# Patient Record
Sex: Female | Born: 1955 | Race: White | Hispanic: No | Marital: Married | Smoking: Never smoker
Health system: Southern US, Community
[De-identification: ages and names within clinical notes are randomized; demographics above are authoritative.]

## PROBLEM LIST (undated history)

## (undated) DIAGNOSIS — N816 Rectocele: Secondary | ICD-10-CM

## (undated) HISTORY — DX: Rectocele: N81.6

## (undated) HISTORY — PX: OTHER SURGICAL HISTORY: SHX169

## (undated) HISTORY — PX: APPENDECTOMY: SHX54

## (undated) HISTORY — PX: TONSILLECTOMY: SUR1361

---

## 1985-01-11 HISTORY — PX: TUBAL LIGATION: SHX77

## 2001-01-26 ENCOUNTER — Ambulatory Visit (HOSPITAL_COMMUNITY): Admission: RE | Admit: 2001-01-26 | Discharge: 2001-01-26 | Payer: Self-pay | Admitting: Specialist

## 2001-01-26 ENCOUNTER — Encounter: Payer: Self-pay | Admitting: Specialist

## 2001-02-14 ENCOUNTER — Other Ambulatory Visit: Admission: RE | Admit: 2001-02-14 | Discharge: 2001-02-14 | Payer: Self-pay | Admitting: Obstetrics and Gynecology

## 2002-01-26 ENCOUNTER — Encounter: Payer: Self-pay | Admitting: Obstetrics and Gynecology

## 2002-01-26 ENCOUNTER — Ambulatory Visit (HOSPITAL_COMMUNITY): Admission: RE | Admit: 2002-01-26 | Discharge: 2002-01-26 | Payer: Self-pay | Admitting: Obstetrics and Gynecology

## 2003-01-28 ENCOUNTER — Ambulatory Visit (HOSPITAL_COMMUNITY): Admission: RE | Admit: 2003-01-28 | Discharge: 2003-01-28 | Payer: Self-pay | Admitting: Obstetrics and Gynecology

## 2004-01-30 ENCOUNTER — Ambulatory Visit (HOSPITAL_COMMUNITY): Admission: RE | Admit: 2004-01-30 | Discharge: 2004-01-30 | Payer: Self-pay | Admitting: Obstetrics and Gynecology

## 2005-02-01 ENCOUNTER — Ambulatory Visit (HOSPITAL_COMMUNITY): Admission: RE | Admit: 2005-02-01 | Discharge: 2005-02-01 | Payer: Self-pay | Admitting: Obstetrics and Gynecology

## 2005-09-28 ENCOUNTER — Ambulatory Visit: Payer: Self-pay | Admitting: Gastroenterology

## 2005-10-06 ENCOUNTER — Ambulatory Visit: Payer: Self-pay | Admitting: Gastroenterology

## 2005-10-06 ENCOUNTER — Ambulatory Visit (HOSPITAL_COMMUNITY): Admission: RE | Admit: 2005-10-06 | Discharge: 2005-10-06 | Payer: Self-pay | Admitting: Gastroenterology

## 2006-02-02 ENCOUNTER — Ambulatory Visit (HOSPITAL_COMMUNITY): Admission: RE | Admit: 2006-02-02 | Discharge: 2006-02-02 | Payer: Self-pay | Admitting: Obstetrics and Gynecology

## 2007-02-06 ENCOUNTER — Ambulatory Visit (HOSPITAL_COMMUNITY): Admission: RE | Admit: 2007-02-06 | Discharge: 2007-02-06 | Payer: Self-pay | Admitting: Obstetrics and Gynecology

## 2007-02-14 ENCOUNTER — Other Ambulatory Visit: Admission: RE | Admit: 2007-02-14 | Discharge: 2007-02-14 | Payer: Self-pay | Admitting: Obstetrics and Gynecology

## 2008-02-13 ENCOUNTER — Ambulatory Visit (HOSPITAL_COMMUNITY): Admission: RE | Admit: 2008-02-13 | Discharge: 2008-02-13 | Payer: Self-pay | Admitting: Obstetrics and Gynecology

## 2008-02-19 ENCOUNTER — Other Ambulatory Visit: Admission: RE | Admit: 2008-02-19 | Discharge: 2008-02-19 | Payer: Self-pay | Admitting: Obstetrics and Gynecology

## 2009-02-18 ENCOUNTER — Ambulatory Visit (HOSPITAL_COMMUNITY): Admission: RE | Admit: 2009-02-18 | Discharge: 2009-02-18 | Payer: Self-pay | Admitting: Obstetrics and Gynecology

## 2009-02-26 ENCOUNTER — Other Ambulatory Visit: Admission: RE | Admit: 2009-02-26 | Discharge: 2009-02-26 | Payer: Self-pay | Admitting: Obstetrics and Gynecology

## 2010-01-30 ENCOUNTER — Other Ambulatory Visit: Payer: Self-pay | Admitting: Obstetrics and Gynecology

## 2010-01-30 DIAGNOSIS — Z1239 Encounter for other screening for malignant neoplasm of breast: Secondary | ICD-10-CM

## 2010-02-01 ENCOUNTER — Encounter: Payer: Self-pay | Admitting: Obstetrics and Gynecology

## 2010-02-23 ENCOUNTER — Ambulatory Visit (HOSPITAL_COMMUNITY)
Admission: RE | Admit: 2010-02-23 | Discharge: 2010-02-23 | Disposition: A | Discharge status: Home / Self Care | Payer: BC Managed Care – PPO | Source: Ambulatory Visit | Attending: Obstetrics and Gynecology | Admitting: Obstetrics and Gynecology

## 2010-02-23 DIAGNOSIS — Z1231 Encounter for screening mammogram for malignant neoplasm of breast: Secondary | ICD-10-CM | POA: Insufficient documentation

## 2010-02-23 DIAGNOSIS — Z1239 Encounter for other screening for malignant neoplasm of breast: Secondary | ICD-10-CM

## 2010-03-02 ENCOUNTER — Other Ambulatory Visit (HOSPITAL_COMMUNITY)
Admission: RE | Admit: 2010-03-02 | Discharge: 2010-03-02 | Disposition: A | Discharge status: Home / Self Care | Payer: BC Managed Care – PPO | Source: Ambulatory Visit | Attending: Obstetrics & Gynecology | Admitting: Obstetrics & Gynecology

## 2010-03-02 DIAGNOSIS — Z01419 Encounter for gynecological examination (general) (routine) without abnormal findings: Secondary | ICD-10-CM | POA: Insufficient documentation

## 2010-03-03 ENCOUNTER — Other Ambulatory Visit: Payer: Self-pay | Admitting: Obstetrics & Gynecology

## 2010-05-29 NOTE — Op Note (Signed)
Julie Humphrey, Julie Humphrey               ACCOUNT NO.:  [id number]   MEDICAL RECORD NO.:  [id number]          PATIENT TYPE:  AMB   LOCATION:  DAY                           FACILITY:  [facility]   PHYSICIAN:  Kassie Mends, M.D.      DATE OF BIRTH:  December 28, 1955   DATE OF PROCEDURE:  10/06/2005  DATE OF DISCHARGE:                                 OPERATIVE REPORT   REFERRING PHYSICIAN:  Tilda Burrow, M.D.   PROCEDURE:  Colonoscopy.   INDICATION FOR EXAM:  Ms. Gebel is a 55 year old female who presented with  rectal bleeding.  She also presents for average-risk colon cancer screening.   FINDINGS:  1..  Normal colon without evidence of polyps, masses,  inflammatory changes, internal hemorrhoids or vascular ectasias.  No  diverticula evident.  1. Normal retroflexed view of the rectum.  2. Upon withdrawn the scope, she has 2 small internal-external      hemorrhoids.   RECOMMENDATIONS:  1. High-fiber diet.  Information given on hemorrhoids and high-fiber diet.  2. Follow with Dr. Christin Bach.   MEDICATIONS:  1. Demerol 75 mg IV.  2. Versed 5 mg IV.   PROCEDURE TECHNIQUE:  Physical exam was performed and informed consent was  obtained from the patient after explaining the benefits, risks and  alternatives to the procedure, which the patient appeared to understand and  so stated.  The patient was connected to the monitor and placed in the left  lateral position.  Continuous oxygen was provided by nasal cannula and IV  medicine administered through an indwelling cannula.  After rectal exam and  administration of sedation, the patient's rectum was intubated and the scope  was advanced under direct visualization  to the cecum.  The scope was subsequently removed by carefully examining the  anatomy, integrity and texture of the mucosa on the way out.  The patient  was recovered in the endoscopy suite and discharged home in satisfactory  condition.      Kassie Mends, M.D.  Electronically  Signed     SM/MEDQ  D:  10/06/2005  T:  10/08/2005  Job:  [id number]   cc:   Tilda Burrow, M.D.  Fax: [phone]

## 2010-05-29 NOTE — Consult Note (Signed)
NAMESUKARI, Julie Humphrey               ACCOUNT NO.:  [id number]   MEDICAL RECORD NO.:  [id number]          PATIENT TYPE:  AMB   LOCATION:  DAY                           FACILITY:  [facility]   PHYSICIAN:  Kassie Mends, M.D.      DATE OF BIRTH:  1955/03/22   DATE OF CONSULTATION:  09/28/2005  DATE OF DISCHARGE:                                   CONSULTATION   REASON FOR CONSULTATION:  Rectal bleeding.   HISTORY OF PRESENT ILLNESS:  Julie Humphrey is a 55 year old Caucasian female,  who tells me approximately 10 days ago she went to the bathroom.  She had a  regular bowel movement which was followed by a large amount of bright red  rectal bleeding.  She noticed it on the toilet paper as well as in the  commode.  She denies any abdominal or rectal pain at that time.  She did  complain of abdominal bloating for the last several months.  She noticed a  small amount of rectal pruritus.  Denies any known hemorrhoid disease.  She  generally has bowel movements once or twice a day, rarely has diarrhea,  denies any problems with constipation or abdominal pain.  Her weight has  steadily increasing.  She has had a normal TSH through Dr. Rayna Sexton  office.   PAST MEDICAL HISTORY:  Tonsillectomy, tubal ligation.   CURRENT MEDICATIONS:  Vitamin A 500 mg daily, vitamin B complex once daily,  vitamin C 500 mg daily, vitamin E 400 units daily, zinc once daily, selenium  once daily, evening primrose once daily, fish oil 1 g daily, flax seed 1 g  daily, chlorophyll once daily, glucosamine  once daily, calcium and vitamin  D 1200 mg daily, magnesium 55 mg daily.   ALLERGIES:  No known drug allergies.   FAMILY HISTORY:  No known family history of colorectal carcinoma or other  chronic GI problems noted.  Mother is 34 and has history of deep venous  thrombosis, father age 81 deceased secondary to myocardial infarction.   SOCIAL HISTORY:  Julie Humphrey has been married for 30 years.  She has three  grown healthy  children.  She is a Arts development officer.  She smokes socially.  She  drinks two to three days a week up to two alcoholic beverages, denies any  drug use.   REVIEW OF SYSTEMS:  CONSTITUTIONAL:  See HPI.  She denies any fatigue.  CARDIOVASCULAR:  Denies palpitations. PULMONARY:  No shortness of breath,  dyspnea, cough, hemoptysis.  GI:  See HPI.  Denies any dysphagia,  odynophagia, anorexia or early satiety, heartburn or indigestion.  HEMATOLOGICAL:  She has had episodic nosebleeds, has not had any recently.  She does bruise easily.   PHYSICAL EXAMINATION:  VITAL SIGNS:  Weight 192 pounds.  Height 66,  temperature 98.8, blood pressure 142/80, pulse 64.  GENERAL:  Julie Humphrey is a 55 year old obese Caucasian female who is alert,  oriented x4, in no acute distress.  HEENT:  Sclerae clear, nonicteric. Conjunctivae pink.  Oropharynx pink and  moist without lesions.  NECK:  Thyroid not palpable, without  mass or thyromegaly.  CHEST:  Heart regular rate and rhythm, normal S1, S2 without murmurs, rubs  or gallops.  LUNGS:  Clear to auscultation bilaterally.  ABDOMEN:  Protuberant with positive bowel sounds x 4, soft, nontender,  nondistended, without palpable masses hepatosplenomegaly,  without  tenderness or guarding.  EXTREMITIES:  Without clubbing or edema bilaterally.  SKIN:  Pink, warm and dry.  She does have multiple ecchymoses a large one to  her right upper extremity.   IMPRESSION:  Julie Humphrey is a 55 year old Caucasian female with an episode  of large volume rectal bleeding approximately 10 days ago.  This episode was  self limited.  She has never had a colonoscopy.  I discussed with her the  fact that have benign anorectal source of her bleeding including hemorrhoid,  diverticular bleeding or less likely, polyp or colorectal carcinoma.  She is  going to require further evaluation at this time.  She also tells me she is  bruising easily and has a history of episodic nose bleeds although has  not  had any recently so will check a CBC today.   PLAN:  1. Check CBC.  2. Follow-up with __________  I have discussed the procedure including the      risks and benefits including but not limited bleeding, infection,      perforation, drug reaction, __________ appropriate signed consent has      been obtained.  Further recommendation pending procedure.   We would like to thank Dr. Emelda Fear and Zerita Boers for allowing [place] to  participate in the care of Julie Humphrey.      Julie Humphrey, N.P.      Kassie Mends, M.D.  Electronically Signed    KC/MEDQ  D:  09/28/2005  T:  09/29/2005  Job:  [id number]   cc:   Tilda Burrow, M.D.  Fax: [phone]

## 2011-01-14 ENCOUNTER — Other Ambulatory Visit: Payer: Self-pay | Admitting: Obstetrics and Gynecology

## 2011-01-14 DIAGNOSIS — Z139 Encounter for screening, unspecified: Secondary | ICD-10-CM

## 2011-03-02 ENCOUNTER — Ambulatory Visit (HOSPITAL_COMMUNITY)
Admission: RE | Admit: 2011-03-02 | Discharge: 2011-03-02 | Disposition: A | Discharge status: Home / Self Care | Payer: Managed Care, Other (non HMO) | Source: Ambulatory Visit | Attending: Obstetrics and Gynecology | Admitting: Obstetrics and Gynecology

## 2011-03-02 DIAGNOSIS — Z1231 Encounter for screening mammogram for malignant neoplasm of breast: Secondary | ICD-10-CM | POA: Insufficient documentation

## 2011-03-02 DIAGNOSIS — Z139 Encounter for screening, unspecified: Secondary | ICD-10-CM

## 2011-03-17 ENCOUNTER — Other Ambulatory Visit: Payer: Self-pay | Admitting: Obstetrics and Gynecology

## 2011-03-17 ENCOUNTER — Other Ambulatory Visit (HOSPITAL_COMMUNITY)
Admission: RE | Admit: 2011-03-17 | Discharge: 2011-03-17 | Disposition: A | Discharge status: Home / Self Care | Payer: Managed Care, Other (non HMO) | Source: Ambulatory Visit | Attending: Obstetrics and Gynecology | Admitting: Obstetrics and Gynecology

## 2011-03-17 DIAGNOSIS — Z01419 Encounter for gynecological examination (general) (routine) without abnormal findings: Secondary | ICD-10-CM | POA: Insufficient documentation

## 2011-03-17 LAB — HM PAP SMEAR: HM Pap smear: NEGATIVE

## 2011-05-12 ENCOUNTER — Other Ambulatory Visit (HOSPITAL_COMMUNITY): Payer: Self-pay | Admitting: Pulmonary Disease

## 2011-05-12 ENCOUNTER — Ambulatory Visit (HOSPITAL_COMMUNITY)
Admission: RE | Admit: 2011-05-12 | Discharge: 2011-05-12 | Disposition: A | Discharge status: Home / Self Care | Payer: Managed Care, Other (non HMO) | Source: Ambulatory Visit | Attending: Pulmonary Disease | Admitting: Pulmonary Disease

## 2011-05-12 DIAGNOSIS — M25519 Pain in unspecified shoulder: Secondary | ICD-10-CM

## 2012-01-21 ENCOUNTER — Other Ambulatory Visit: Payer: Self-pay | Admitting: Obstetrics and Gynecology

## 2012-01-21 DIAGNOSIS — Z139 Encounter for screening, unspecified: Secondary | ICD-10-CM

## 2012-03-02 ENCOUNTER — Ambulatory Visit (HOSPITAL_COMMUNITY)
Admission: RE | Admit: 2012-03-02 | Discharge: 2012-03-02 | Disposition: A | Discharge status: Home / Self Care | Payer: Managed Care, Other (non HMO) | Source: Ambulatory Visit | Attending: Obstetrics and Gynecology | Admitting: Obstetrics and Gynecology

## 2012-03-02 DIAGNOSIS — Z139 Encounter for screening, unspecified: Secondary | ICD-10-CM

## 2012-03-02 DIAGNOSIS — Z1231 Encounter for screening mammogram for malignant neoplasm of breast: Secondary | ICD-10-CM | POA: Insufficient documentation

## 2012-03-02 LAB — HM MAMMOGRAPHY

## 2012-03-23 ENCOUNTER — Encounter: Payer: Self-pay | Admitting: *Deleted

## 2012-03-29 ENCOUNTER — Encounter: Payer: Self-pay | Admitting: Obstetrics and Gynecology

## 2012-03-29 ENCOUNTER — Ambulatory Visit (INDEPENDENT_AMBULATORY_CARE_PROVIDER_SITE_OTHER): Payer: Managed Care, Other (non HMO) | Admitting: Obstetrics and Gynecology

## 2012-03-29 ENCOUNTER — Other Ambulatory Visit (HOSPITAL_COMMUNITY)
Admission: RE | Admit: 2012-03-29 | Discharge: 2012-03-29 | Disposition: A | Discharge status: Home / Self Care | Payer: 59 | Source: Ambulatory Visit | Attending: Obstetrics and Gynecology | Admitting: Obstetrics and Gynecology

## 2012-03-29 ENCOUNTER — Other Ambulatory Visit: Payer: Self-pay | Admitting: Obstetrics and Gynecology

## 2012-03-29 VITALS — BP 100/70 | HR 72 | Resp 16 | Ht 66.0 in | Wt 208.8 lb

## 2012-03-29 DIAGNOSIS — Z01419 Encounter for gynecological examination (general) (routine) without abnormal findings: Secondary | ICD-10-CM | POA: Insufficient documentation

## 2012-03-29 DIAGNOSIS — N816 Rectocele: Secondary | ICD-10-CM

## 2012-03-29 DIAGNOSIS — Z1389 Encounter for screening for other disorder: Secondary | ICD-10-CM

## 2012-03-29 DIAGNOSIS — Z1212 Encounter for screening for malignant neoplasm of rectum: Secondary | ICD-10-CM

## 2012-03-29 DIAGNOSIS — Z1151 Encounter for screening for human papillomavirus (HPV): Secondary | ICD-10-CM | POA: Insufficient documentation

## 2012-03-29 HISTORY — DX: Rectocele: N81.6

## 2012-03-29 LAB — POCT URINALYSIS DIPSTICK
Glucose, UA: NEGATIVE
Ketones, UA: NEGATIVE
Leukocytes, UA: NEGATIVE
Protein, UA: NEGATIVE

## 2012-03-29 MED ORDER — TRETINOIN 0.025 % EX CREA: TOPICAL_CREAM | Freq: Every day | CUTANEOUS | Status: AC

## 2012-03-29 NOTE — Progress Notes (Signed)
Subjective:    Julie Humphrey is a 57 y.o. female who presents for annual exam. The patient has noted a sense of pelvic heaviness, right-sided more than left. No change in clothing no weight gain or weight loss other than 10 pounds loss 2 dietary changes. The patient is sexually active. GYN screening history: last pap: was normal. The patient is not taking hormone replacement therapy. Patient denies post-menopausal vaginal bleeding.. The patient wears seatbelts: yes. The patient participates in regular exercise: no. Has the patient ever been transfused or tattooed?: no. The patient reports that there is not domestic violence in her life.   Menstrual History: OB History   Grav Para Term Preterm Abortions TAB SAB Ect Mult Living                  Menarche age:  Patient's last menstrual period was 10/30/2006.      Review of Systems Genitourinary:occasional SUI  BP 100/70  Pulse 72  Resp 16  Ht 5\' 6"  (1.676 m)  Wt 208 lb 12.8 oz (94.711 kg)  BMI 33.72 kg/m2  LMP 10/30/2006  General Appearance:    Alert, cooperative, no distress, appears stated age  Head:    Normocephalic, without obvious abnormality, atraumatic  Eyes:    PERRL, conjunctiva/corneas clear, EOM's intact, fundi    benign, both eyes  Ears:    Normal TM's and external ear canals, both ears  Nose:   Nares normal, septum midline, mucosa normal, no drainage    or sinus tenderness  Throat:   Lips, mucosa, and tongue normal; teeth and gums normal  Neck:   Supple, symmetrical, trachea midline, no adenopathy;    thyroid:  no enlargement/tenderness/nodules; no carotid   bruit or JVD  Back:     Symmetric, no curvature, ROM normal, no CVA tenderness  Lungs:     Clear to auscultation bilaterally, respirations unlabored  Chest Wall:    No tenderness or deformity   Heart:    Regular rate and rhythm, S1 and S2 normal, no murmur, rub   or gallop  Breast Exam:    No tenderness, masses, or nipple abnormality  Abdomen:     Soft,  non-tender, bowel sounds active all four quadrants,    no masses, no organomegaly  Genitalia:    Normal female without lesion, discharge or tenderness  Rectal:    Normal tone, normal prostate, no masses or tenderness;   guaiac negative stool, SMALL RECOTCELE TO GREATER THAN 90 DEGREE  Extremities:   Extremities normal, atraumatic, no cyanosis or edema  Pulses:   2+ and symmetric all extremities  Skin:   Skin color, texture, turgor normal, no rashes or lesions  Lymph nodes:   Cervical, supraclavicular, and axillary nodes normal  Neurologic:   CNII-XII intact, normal strength, sensation and reflexes    throughout      Assessment:    Normal gyn exam cystic acne, treated with tretinoiin   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  Plan:    All questions answered. Await pap smear results. Urinalysis. Reassured regarding a pelvic heaviness. Rectocele discussed and patient will follow from a

## 2012-03-29 NOTE — Patient Instructions (Signed)
exercise options reviewed with details including exercise program per room focusing on asymmetric exercises while watching TV, weightbearing exercises in order to increase muscle mass

## 2013-01-15 ENCOUNTER — Other Ambulatory Visit: Payer: Self-pay | Admitting: Obstetrics and Gynecology

## 2013-01-15 DIAGNOSIS — Z139 Encounter for screening, unspecified: Secondary | ICD-10-CM

## 2013-03-05 ENCOUNTER — Ambulatory Visit (HOSPITAL_COMMUNITY)
Admission: RE | Admit: 2013-03-05 | Discharge: 2013-03-05 | Disposition: A | Discharge status: Home / Self Care | Payer: Managed Care, Other (non HMO) | Source: Ambulatory Visit | Attending: Obstetrics and Gynecology | Admitting: Obstetrics and Gynecology

## 2013-03-05 ENCOUNTER — Ambulatory Visit (HOSPITAL_COMMUNITY): Payer: 59

## 2013-03-05 DIAGNOSIS — Z139 Encounter for screening, unspecified: Secondary | ICD-10-CM

## 2013-03-05 DIAGNOSIS — Z1231 Encounter for screening mammogram for malignant neoplasm of breast: Secondary | ICD-10-CM | POA: Insufficient documentation

## 2013-04-04 ENCOUNTER — Encounter: Payer: Self-pay | Admitting: Obstetrics and Gynecology

## 2013-04-04 ENCOUNTER — Ambulatory Visit (INDEPENDENT_AMBULATORY_CARE_PROVIDER_SITE_OTHER): Payer: Managed Care, Other (non HMO) | Admitting: Obstetrics and Gynecology

## 2013-04-04 VITALS — BP 120/82 | Ht 66.0 in | Wt 218.0 lb

## 2013-04-04 DIAGNOSIS — Z0000 Encounter for general adult medical examination without abnormal findings: Secondary | ICD-10-CM

## 2013-04-04 DIAGNOSIS — N816 Rectocele: Secondary | ICD-10-CM

## 2013-04-04 DIAGNOSIS — R635 Abnormal weight gain: Secondary | ICD-10-CM | POA: Insufficient documentation

## 2013-04-04 DIAGNOSIS — Z01419 Encounter for gynecological examination (general) (routine) without abnormal findings: Secondary | ICD-10-CM

## 2013-04-04 LAB — COMPREHENSIVE METABOLIC PANEL
ALBUMIN: 4.3 g/dL (ref 3.5–5.2)
ALK PHOS: 53 U/L (ref 39–117)
ALT: 31 U/L (ref 0–35)
AST: 27 U/L (ref 0–37)
BILIRUBIN TOTAL: 1.2 mg/dL (ref 0.2–1.2)
BUN: 16 mg/dL (ref 6–23)
CHLORIDE: 101 meq/L (ref 96–112)
CO2: 29 mEq/L (ref 19–32)
Calcium: 9.3 mg/dL (ref 8.4–10.5)
Creat: 0.87 mg/dL (ref 0.50–1.10)
GLUCOSE: 99 mg/dL (ref 70–99)
Potassium: 4.7 mEq/L (ref 3.5–5.3)
SODIUM: 139 meq/L (ref 135–145)
Total Protein: 7 g/dL (ref 6.0–8.3)

## 2013-04-04 LAB — TSH: TSH: 2.585 u[IU]/mL (ref 0.350–4.500)

## 2013-04-04 MED ORDER — PHENTERMINE HCL 37.5 MG PO CAPS
37.5000 mg | ORAL_CAPSULE | ORAL | Status: DC
Start: 2013-04-04 — End: 2013-05-09

## 2013-04-04 NOTE — Patient Instructions (Addendum)
Check out phentermine for weight loss. Please check your blood pressure while on the medication and weigh daily. Also start exercising daily to help with weight loss. Return for a recheck in one month to check progress.

## 2013-04-04 NOTE — Progress Notes (Addendum)
This chart was scribed by Jenne Campus, Medical Scribe, for Dr. Mallory Shirk on 04/04/12 at 9:59 AM. This chart was reviewed by Dr. Mallory Shirk and is accurate.  Assessment:  Annual Gyn Exam   Plan:  1. Pap smear not done, next pap due 2 years 2. Return annually or prn 3    Annual mammogram advised 4.   Weight loss discussed.  5.   TSH and Comp Met Subjective:  Julie Humphrey is a 58 y.o. female No obstetric history on file. who presents for annual exam. Patient's last menstrual period was 10/30/2006. The patient has no complaints today. Needs annual blood work.   Last PAP 03/2012: NEGATIVE FOR INTRAEPITHELIAL LESIONS OR MALIGNANCY.  PCP is Dr. Luan Pulling  The following portions of the patient's history were reviewed and updated as appropriate: allergies, current medications, past family history, past medical history, past social history, past surgical history and problem list.  Review of Systems Constitutional: negative Gastrointestinal: negative Genitourinary: None  Objective:  BP 120/82  Ht 5' 6"  (1.676 m)  Wt 218 lb (98.884 kg)  BMI 35.20 kg/m2  LMP 10/30/2006   BMI: Body mass index is 35.2 kg/(m^2).  Chaperone present for exam which was performed with pt's permission General Appearance: Alert, appropriate appearance for age. No acute distress HEENT: Grossly normal Neck / Thyroid: no thyromegaly noted  Cardiovascular: RRR; normal S1, S2, no murmur Lungs: CTA bilaterally Back: No CVAT Breast Exam: No dimpling, nipple retraction or discharge. No masses or nodes., Normal to inspection, Normal breast tissue bilaterally and No masses or nodes.No dimpling, nipple retraction or discharge. Gastrointestinal: Soft, non-tender, no masses or organomegaly Pelvic Exam: External genitalia: normal general appearance Vaginal: atrophic mucosa Cervix: normal appearance Adnexa: normal bimanual exam Uterus: normal single, nontender Good anterior support noted Rectovaginal: low  rectocele noted Lymphatic Exam: Non-palpable nodes in neck, clavicular, axillary, or inguinal regions  Skin: no rash or abnormalities Neurologic: Normal gait and speech, no tremor  Psychiatric: Alert and oriented, appropriate affect.  Urinalysis:Not done  Mallory Shirk. MD Pgr [phone] 10:11 AM

## 2013-04-06 ENCOUNTER — Telehealth: Payer: Self-pay | Admitting: *Deleted

## 2013-04-06 NOTE — Telephone Encounter (Signed)
Pt informed of WNL thyroid and CMP results per Dr. Glo Herring, f/u 2 years.

## 2013-04-06 NOTE — Telephone Encounter (Signed)
Message copied by Doyne Keel on Fri Apr 06, 2013  8:45 AM ------      Message from: Jonnie Kind      Created: Thu Apr 05, 2013 10:29 PM       Normal thyroid function and CMP. Followup 2 yr ------

## 2013-05-09 ENCOUNTER — Ambulatory Visit (INDEPENDENT_AMBULATORY_CARE_PROVIDER_SITE_OTHER): Payer: Managed Care, Other (non HMO) | Admitting: Obstetrics and Gynecology

## 2013-05-09 ENCOUNTER — Encounter: Payer: Self-pay | Admitting: Obstetrics and Gynecology

## 2013-05-09 VITALS — BP 122/80 | Ht 66.0 in | Wt 206.0 lb

## 2013-05-09 DIAGNOSIS — T50905A Adverse effect of unspecified drugs, medicaments and biological substances, initial encounter: Principal | ICD-10-CM

## 2013-05-09 DIAGNOSIS — R634 Abnormal weight loss: Secondary | ICD-10-CM

## 2013-05-09 DIAGNOSIS — M6740 Ganglion, unspecified site: Secondary | ICD-10-CM | POA: Insufficient documentation

## 2013-05-09 MED ORDER — PHENTERMINE HCL 37.5 MG PO CAPS: 37.5000 mg | ORAL_CAPSULE | ORAL | Status: DC

## 2013-05-09 NOTE — Progress Notes (Signed)
This chart was scribed by Jenne Campus, Medical Scribe, for Dr. Mallory Shirk on 05/09/13 at 10:02 AM. This chart was reviewed by Dr. Mallory Shirk and is accurate.   [facility] Visit  Patient name: Julie Humphrey MRN [id number]  Date of birth: March 26, 1955  CC & HPI:  Julie Humphrey is a 58 y.o. female presenting today for BP and weight loss check while on phentermine. Reports 12 lb weight loss and improved bilateral ankle pain.   ROS:  12lb weight loss since last visit one month ago Improved bilateral ankle pain  Non-painful tiny ganglion cyst in the center palm of right hand, reports family h/o same  No other complaints  Pertinent History Reviewed:  Medical & Surgical Hx:  Reviewed: Significant for rectocele Medications: Reviewed & Updated - see associated section Social History: Reviewed -  reports that she has never smoked. She has never used smokeless tobacco.  Objective Findings:  Vitals: BP 122/80  Ht 5\' 6"  (1.676 m)  Wt 206 lb (93.441 kg)  BMI 33.27 kg/m2  LMP 10/30/2006  Physical Examination: Not indicated   Assessment & Plan:  A: 1. 12 lb weight loss  2. Tiny ganglion cyst in right hand   P: 1. Continue phentermine for 3 months 2. Referral to Roseanne Kaufman, hand surgery, given 3. Pt to return in 2 months for cholesterol check

## 2013-07-04 ENCOUNTER — Ambulatory Visit: Payer: Managed Care, Other (non HMO) | Admitting: Obstetrics and Gynecology

## 2013-07-16 ENCOUNTER — Ambulatory Visit: Payer: Managed Care, Other (non HMO) | Admitting: Obstetrics and Gynecology

## 2013-07-25 ENCOUNTER — Encounter: Payer: Self-pay | Admitting: Obstetrics and Gynecology

## 2013-07-25 ENCOUNTER — Ambulatory Visit (INDEPENDENT_AMBULATORY_CARE_PROVIDER_SITE_OTHER): Payer: Managed Care, Other (non HMO) | Admitting: Obstetrics and Gynecology

## 2013-07-25 VITALS — BP 120/78 | Ht 66.0 in | Wt 198.0 lb

## 2013-07-25 DIAGNOSIS — Z13220 Encounter for screening for lipoid disorders: Secondary | ICD-10-CM

## 2013-07-25 DIAGNOSIS — T50905A Adverse effect of unspecified drugs, medicaments and biological substances, initial encounter: Secondary | ICD-10-CM

## 2013-07-25 DIAGNOSIS — R634 Abnormal weight loss: Secondary | ICD-10-CM

## 2013-07-25 LAB — LIPID PANEL
CHOL/HDL RATIO: 2.4 ratio
Cholesterol: 224 mg/dL — ABNORMAL HIGH (ref 0–200)
HDL: 94 mg/dL (ref >39)
LDL Cholesterol: 119 mg/dL — ABNORMAL HIGH (ref 0–99)
TRIGLYCERIDES: 55 mg/dL (ref <150)
VLDL: 11 mg/dL (ref 0–40)

## 2013-07-25 MED ORDER — PHENTERMINE HCL 30 MG PO CAPS: 30.0000 mg | ORAL_CAPSULE | ORAL | Status: DC

## 2013-07-25 NOTE — Progress Notes (Signed)
This chart was scribed for Dr. Jonnie Kind by Neta Ehlers, Medical Scribe, on 07/25/13 initiated at 10:08 am.   [facility] Visit  Patient name: Julie Humphrey MRN [id number]  Date of birth: 15-May-1955  CC & HPI:  Julie Humphrey is a 58 y.o. female presenting today for a two month follow-up.   Julie Humphrey endorses a weight loss of 20 pounds by using [employer] and phentermine; she denies elevated BP with phentermine. She also denies chronic urinary incontinence. She attributes one episode of urinary incontinence to a sever episode of coughing.   ROS:  + SUI + congestion + cough  No other complaints   Pertinent History Reviewed:   Reviewed: Significant for:  Medical: obesity                               Surgical Hx:    Medications: Reviewed & Updated - see associated section Social History: Reviewed -  reports that she has never smoked. She has never used smokeless tobacco.  Objective Findings:  Vitals: Blood pressure 120/78, height 5\' 6"  (1.676 m), weight 198 lb (89.812 kg), last menstrual period 10/30/2006.    Assessment & Plan:   A: 1. Obesity improved  BMI 31   P: 1. Renew phentermine for two months at lower dose of 30 mg  2. Follow-up in two months

## 2013-07-26 ENCOUNTER — Telehealth: Payer: Self-pay | Admitting: Obstetrics and Gynecology

## 2013-07-26 NOTE — Telephone Encounter (Signed)
CHolesterol HDL very high , which is good

## 2013-07-27 ENCOUNTER — Telehealth: Payer: Self-pay | Admitting: Obstetrics and Gynecology

## 2013-07-27 NOTE — Telephone Encounter (Signed)
Pt aware of Cholesterol results. Pt asked for a copy to be mailed to her.

## 2013-10-03 ENCOUNTER — Ambulatory Visit: Payer: Managed Care, Other (non HMO) | Admitting: Obstetrics and Gynecology

## 2013-10-09 ENCOUNTER — Ambulatory Visit: Payer: Managed Care, Other (non HMO) | Admitting: Obstetrics and Gynecology

## 2013-10-10 ENCOUNTER — Encounter: Payer: Self-pay | Admitting: Obstetrics and Gynecology

## 2013-10-10 ENCOUNTER — Ambulatory Visit (INDEPENDENT_AMBULATORY_CARE_PROVIDER_SITE_OTHER): Payer: Managed Care, Other (non HMO) | Admitting: Obstetrics and Gynecology

## 2013-10-10 VITALS — BP 124/80 | Ht 66.0 in | Wt 191.0 lb

## 2013-10-10 DIAGNOSIS — E669 Obesity, unspecified: Secondary | ICD-10-CM

## 2013-10-10 MED ORDER — PHENTERMINE HCL 30 MG PO CAPS
30.0000 mg | ORAL_CAPSULE | ORAL | Status: DC
Start: 2013-10-10 — End: 2017-10-13

## 2013-10-10 NOTE — Progress Notes (Signed)
Patient ID: Julie Humphrey, female   DOB: [birth year], 58 y.o.   MRN: [id number] Pt here today for follow up to recheck weight and BP.

## 2013-10-10 NOTE — Progress Notes (Signed)
   [facility] Visit  Patient name: Julie Humphrey MRN [id number]  Date of birth: [birth year]  CC & HPI:  Julie Humphrey is a 58 y.o. female presenting today for f/u on weight loss and BP. She states that she does a few hours a week of yard work and housework. She states that she will rides the bikes at the beach. She states that she does not work currently. She states that she has loss 7 lbs since her last visit in July. She states that her Rx of phentermine made her motivated to do work. She states that her husband is her support system.  ROS:  +Weight Loss No other complaints  Pertinent History Reviewed:   Reviewed: Significant for  Medical         Past Medical History  Diagnosis Date  . Rectocele 03/29/2012                              Surgical Hx:    Past Surgical History  Procedure Laterality Date  . Tonsillectomy    . Tubal ligation Bilateral 1987   Medications: Reviewed & Updated - see associated section                      Current outpatient prescriptions:calcium carbonate (OS-CAL) 600 MG TABS, Take 600 mg by mouth 2 (two) times daily with a meal., Disp: , Rfl: ;  cholecalciferol (VITAMIN D) 1000 UNITS tablet, Take 1,000 Units by mouth daily., Disp: , Rfl: ;  Coenzyme Q10 200 MG capsule, Take 200 mg by mouth daily., Disp: , Rfl: ;  Evening Primrose Oil 500 MG CAPS, Take by mouth., Disp: , Rfl: ;  Glucosamine-Chondroitin (COSAMIN DS PO), Take by mouth., Disp: , Rfl:  KRILL OIL OMEGA-3 PO, Take by mouth., Disp: , Rfl: ;  Misc Natural Products (TURMERIC CURCUMIN) CAPS, Take 4 capsules by mouth daily., Disp: , Rfl: ;  phentermine 30 MG capsule, Take 1 capsule (30 mg total) by mouth every morning., Disp: 30 capsule, Rfl: 2;  tretinoin (RETIN-A) 0.025 % cream, Apply topically at bedtime., Disp: 45 g, Rfl: 2;  vitamin C (ASCORBIC ACID) 500 MG tablet, Take 500 mg by mouth daily., Disp: , Rfl:  vitamin E 400 UNIT capsule, Take 400 Units by mouth daily., Disp: , Rfl: ;  zinc  gluconate 50 MG tablet, Take 50 mg by mouth daily., Disp: , Rfl:    Social History: Reviewed -  reports that she has never smoked. She has never used smokeless tobacco.  Objective Findings:  Vitals: Blood pressure 124/80, height 5\' 6"  (1.676 m), weight 191 lb (86.637 kg), last menstrual period 10/30/2006.  Physical Examination:  General appearance - alert, well appearing, and in no distress and oriented to person, place, and time, 7 lb weight loss since last visit in July.    Assessment & Plan:   A:  1. 7 lb weight loss since last visit in July. 2.   P:  1. Counseling on weight loss.  2. Refill on Phentermine Rx for 2 months.  3. F/U prn   This chart was scribed for Jonnie Kind, MD by Steva Colder, ED Scribe. The patient was seen in the office at 4:33 PM.

## 2014-05-21 ENCOUNTER — Other Ambulatory Visit: Payer: Self-pay | Admitting: Obstetrics and Gynecology

## 2017-09-30 ENCOUNTER — Encounter (HOSPITAL_COMMUNITY)
Admission: EM | Disposition: A | Discharge status: Home / Self Care | Payer: Self-pay | Source: Home / Self Care | Attending: Emergency Medicine

## 2017-09-30 ENCOUNTER — Emergency Department (HOSPITAL_COMMUNITY): Payer: Self-pay | Admitting: Anesthesiology

## 2017-09-30 ENCOUNTER — Other Ambulatory Visit: Payer: Self-pay

## 2017-09-30 ENCOUNTER — Emergency Department (HOSPITAL_COMMUNITY): Payer: Self-pay

## 2017-09-30 ENCOUNTER — Encounter (HOSPITAL_COMMUNITY): Payer: Self-pay | Admitting: Emergency Medicine

## 2017-09-30 ENCOUNTER — Observation Stay (HOSPITAL_COMMUNITY)
Admission: EM | Admit: 2017-09-30 | Discharge: 2017-10-01 | Disposition: A | Discharge status: Home / Self Care | Payer: Managed Care, Other (non HMO) | Attending: General Surgery | Admitting: General Surgery

## 2017-09-30 DIAGNOSIS — K381 Appendicular concretions: Secondary | ICD-10-CM | POA: Insufficient documentation

## 2017-09-30 DIAGNOSIS — Z9049 Acquired absence of other specified parts of digestive tract: Secondary | ICD-10-CM

## 2017-09-30 DIAGNOSIS — K3580 Unspecified acute appendicitis: Secondary | ICD-10-CM

## 2017-09-30 DIAGNOSIS — K3533 Acute appendicitis with perforation and localized peritonitis, with abscess: Principal | ICD-10-CM | POA: Insufficient documentation

## 2017-09-30 DIAGNOSIS — I700 Atherosclerosis of aorta: Secondary | ICD-10-CM | POA: Insufficient documentation

## 2017-09-30 HISTORY — PX: LAPAROSCOPIC APPENDECTOMY: SHX408

## 2017-09-30 LAB — CBC
HCT: 42.1 % (ref 36.0–46.0)
HEMOGLOBIN: 14.4 g/dL (ref 12.0–15.0)
MCH: 31 pg (ref 26.0–34.0)
MCHC: 34.2 g/dL (ref 30.0–36.0)
MCV: 90.5 fL (ref 78.0–100.0)
Platelets: 192 10*3/uL (ref 150–400)
RBC: 4.65 MIL/uL (ref 3.87–5.11)
RDW: 13.4 % (ref 11.5–15.5)
WBC: 6.9 10*3/uL (ref 4.0–10.5)

## 2017-09-30 LAB — COMPREHENSIVE METABOLIC PANEL
ALK PHOS: 56 U/L (ref 38–126)
ALT: 26 U/L (ref 0–44)
ANION GAP: 8 (ref 5–15)
AST: 24 U/L (ref 15–41)
Albumin: 4.1 g/dL (ref 3.5–5.0)
BUN: 12 mg/dL (ref 8–23)
CO2: 28 mmol/L (ref 22–32)
CREATININE: 0.95 mg/dL (ref 0.44–1.00)
Calcium: 9.6 mg/dL (ref 8.9–10.3)
Chloride: 103 mmol/L (ref 98–111)
Glucose, Bld: 114 mg/dL — ABNORMAL HIGH (ref 70–99)
Potassium: 4.1 mmol/L (ref 3.5–5.1)
Sodium: 139 mmol/L (ref 135–145)
Total Bilirubin: 2.2 mg/dL — ABNORMAL HIGH (ref 0.3–1.2)
Total Protein: 7.7 g/dL (ref 6.5–8.1)

## 2017-09-30 LAB — URINALYSIS, ROUTINE W REFLEX MICROSCOPIC
BILIRUBIN URINE: NEGATIVE
Glucose, UA: NEGATIVE mg/dL
Hgb urine dipstick: NEGATIVE
KETONES UR: NEGATIVE mg/dL
LEUKOCYTES UA: NEGATIVE
Nitrite: NEGATIVE
Protein, ur: NEGATIVE mg/dL
Specific Gravity, Urine: 1.009 (ref 1.005–1.030)
pH: 6 (ref 5.0–8.0)

## 2017-09-30 LAB — LIPASE, BLOOD: Lipase: 25 U/L (ref 11–51)

## 2017-09-30 SURGERY — APPENDECTOMY, LAPAROSCOPIC
Anesthesia: General

## 2017-09-30 MED ORDER — SUCCINYLCHOLINE CHLORIDE 20 MG/ML IJ SOLN
INTRAMUSCULAR | Status: AC
  Filled 2017-09-30: qty 1

## 2017-09-30 MED ORDER — FENTANYL CITRATE (PF) 100 MCG/2ML IJ SOLN
INTRAMUSCULAR | Status: DC | PRN
  Administered 2017-09-30 (×2): 50 ug via INTRAVENOUS

## 2017-09-30 MED ORDER — KETOROLAC TROMETHAMINE 30 MG/ML IJ SOLN: 30.0000 mg | Freq: Four times a day (QID) | INTRAMUSCULAR | Status: AC

## 2017-09-30 MED ORDER — LIDOCAINE HCL (PF) 1 % IJ SOLN
INTRAMUSCULAR | Status: AC
  Filled 2017-09-30: qty 5

## 2017-09-30 MED ORDER — DEXAMETHASONE SODIUM PHOSPHATE 4 MG/ML IJ SOLN
INTRAMUSCULAR | Status: AC
  Filled 2017-09-30: qty 1

## 2017-09-30 MED ORDER — ONDANSETRON HCL 4 MG/2ML IJ SOLN
INTRAMUSCULAR | Status: DC | PRN
  Administered 2017-09-30: 4 mg via INTRAVENOUS

## 2017-09-30 MED ORDER — ONDANSETRON HCL 4 MG/2ML IJ SOLN: 4.0000 mg | Freq: Once | INTRAMUSCULAR | Status: DC | PRN

## 2017-09-30 MED ORDER — GLYCOPYRROLATE 0.2 MG/ML IJ SOLN
INTRAMUSCULAR | Status: AC
  Filled 2017-09-30: qty 2

## 2017-09-30 MED ORDER — HYDROCODONE-ACETAMINOPHEN 7.5-325 MG PO TABS: 1.0000 | ORAL_TABLET | Freq: Once | ORAL | Status: DC | PRN

## 2017-09-30 MED ORDER — CHLORHEXIDINE GLUCONATE CLOTH 2 % EX PADS: 6.0000 | MEDICATED_PAD | Freq: Once | CUTANEOUS | Status: DC

## 2017-09-30 MED ORDER — DIPHENHYDRAMINE HCL 50 MG/ML IJ SOLN: 12.5000 mg | Freq: Four times a day (QID) | INTRAMUSCULAR | Status: DC | PRN

## 2017-09-30 MED ORDER — BUPIVACAINE LIPOSOME 1.3 % IJ SUSP
INTRAMUSCULAR | Status: AC
  Filled 2017-09-30: qty 20

## 2017-09-30 MED ORDER — GLYCOPYRROLATE 0.2 MG/ML IJ SOLN
INTRAMUSCULAR | Status: DC | PRN
  Administered 2017-09-30: 0.4 mg via INTRAVENOUS

## 2017-09-30 MED ORDER — KETOROLAC TROMETHAMINE 30 MG/ML IJ SOLN
INTRAMUSCULAR | Status: AC
  Filled 2017-09-30: qty 1

## 2017-09-30 MED ORDER — SODIUM CHLORIDE 0.9 % IJ SOLN
INTRAMUSCULAR | Status: AC
  Filled 2017-09-30: qty 10

## 2017-09-30 MED ORDER — DIPHENHYDRAMINE HCL 12.5 MG/5ML PO ELIX: 12.5000 mg | ORAL_SOLUTION | Freq: Four times a day (QID) | ORAL | Status: DC | PRN

## 2017-09-30 MED ORDER — HYDROMORPHONE HCL 1 MG/ML IJ SOLN: 0.2500 mg | INTRAMUSCULAR | Status: DC | PRN

## 2017-09-30 MED ORDER — MEPERIDINE HCL 50 MG/ML IJ SOLN: 6.2500 mg | INTRAMUSCULAR | Status: DC | PRN

## 2017-09-30 MED ORDER — SUCCINYLCHOLINE CHLORIDE 20 MG/ML IJ SOLN
INTRAMUSCULAR | Status: DC | PRN
  Administered 2017-09-30: 100 mg via INTRAVENOUS

## 2017-09-30 MED ORDER — IOPAMIDOL (ISOVUE-300) INJECTION 61%
100.0000 mL | Freq: Once | INTRAVENOUS | Status: AC | PRN
  Administered 2017-09-30: 100 mL via INTRAVENOUS

## 2017-09-30 MED ORDER — LORAZEPAM 2 MG/ML IJ SOLN: 1.0000 mg | INTRAMUSCULAR | Status: DC | PRN

## 2017-09-30 MED ORDER — ROCURONIUM BROMIDE 50 MG/5ML IV SOLN
INTRAVENOUS | Status: AC
  Filled 2017-09-30: qty 1

## 2017-09-30 MED ORDER — SODIUM CHLORIDE 0.9 % IV SOLN
INTRAVENOUS | Status: DC
  Administered 2017-09-30: 20:00:00 via INTRAVENOUS

## 2017-09-30 MED ORDER — DEXAMETHASONE SODIUM PHOSPHATE 4 MG/ML IJ SOLN
INTRAMUSCULAR | Status: DC | PRN
  Administered 2017-09-30: 8 mg via INTRAVENOUS

## 2017-09-30 MED ORDER — POVIDONE-IODINE 10 % EX OINT
TOPICAL_OINTMENT | CUTANEOUS | Status: AC
  Filled 2017-09-30: qty 1

## 2017-09-30 MED ORDER — HYDROMORPHONE HCL 1 MG/ML IJ SOLN
1.0000 mg | INTRAMUSCULAR | Status: DC | PRN
  Administered 2017-09-30: 1 mg via INTRAVENOUS
  Filled 2017-09-30: qty 1

## 2017-09-30 MED ORDER — SODIUM CHLORIDE 0.9 % IR SOLN
Status: DC | PRN
  Administered 2017-09-30: 1000 mL

## 2017-09-30 MED ORDER — PIPERACILLIN-TAZOBACTAM 3.375 G IVPB
3.3750 g | Freq: Three times a day (TID) | INTRAVENOUS | Status: DC
  Administered 2017-10-01: 3.375 g via INTRAVENOUS
  Filled 2017-09-30 (×2): qty 50

## 2017-09-30 MED ORDER — ONDANSETRON 4 MG PO TBDP: 4.0000 mg | ORAL_TABLET | Freq: Four times a day (QID) | ORAL | Status: DC | PRN

## 2017-09-30 MED ORDER — LACTATED RINGERS IV SOLN
INTRAVENOUS | Status: DC | PRN
  Administered 2017-09-30: 17:00:00 via INTRAVENOUS

## 2017-09-30 MED ORDER — SODIUM CHLORIDE 0.9 % IV SOLN: INTRAVENOUS | Status: DC | PRN

## 2017-09-30 MED ORDER — OXYCODONE-ACETAMINOPHEN 5-325 MG PO TABS: 1.0000 | ORAL_TABLET | ORAL | Status: DC | PRN

## 2017-09-30 MED ORDER — SODIUM CHLORIDE 0.9 % IV BOLUS
500.0000 mL | Freq: Once | INTRAVENOUS | Status: AC
  Administered 2017-09-30: 500 mL via INTRAVENOUS

## 2017-09-30 MED ORDER — ENOXAPARIN SODIUM 40 MG/0.4ML ~~LOC~~ SOLN
40.0000 mg | SUBCUTANEOUS | Status: DC
  Filled 2017-09-30: qty 0.4

## 2017-09-30 MED ORDER — KETOROLAC TROMETHAMINE 30 MG/ML IJ SOLN
30.0000 mg | Freq: Once | INTRAMUSCULAR | Status: AC | PRN
  Administered 2017-09-30: 30 mg via INTRAVENOUS

## 2017-09-30 MED ORDER — MORPHINE SULFATE (PF) 4 MG/ML IV SOLN
4.0000 mg | Freq: Once | INTRAVENOUS | Status: AC
  Administered 2017-09-30: 4 mg via INTRAVENOUS
  Filled 2017-09-30: qty 1

## 2017-09-30 MED ORDER — DEXMEDETOMIDINE HCL 200 MCG/2ML IV SOLN
INTRAVENOUS | Status: AC
  Filled 2017-09-30: qty 2

## 2017-09-30 MED ORDER — NEOSTIGMINE METHYLSULFATE 10 MG/10ML IV SOLN
INTRAVENOUS | Status: DC | PRN
  Administered 2017-09-30: 3 mg via INTRAVENOUS

## 2017-09-30 MED ORDER — KETOROLAC TROMETHAMINE 30 MG/ML IJ SOLN
INTRAMUSCULAR | Status: DC | PRN
  Administered 2017-09-30: 30 mg via INTRAVENOUS

## 2017-09-30 MED ORDER — PIPERACILLIN-TAZOBACTAM 3.375 G IVPB 30 MIN
3.3750 g | Freq: Once | INTRAVENOUS | Status: AC
  Administered 2017-09-30: 3.375 g via INTRAVENOUS
  Filled 2017-09-30: qty 50

## 2017-09-30 MED ORDER — ONDANSETRON HCL 4 MG/2ML IJ SOLN
4.0000 mg | Freq: Once | INTRAMUSCULAR | Status: AC
  Administered 2017-09-30: 4 mg via INTRAVENOUS
  Filled 2017-09-30: qty 2

## 2017-09-30 MED ORDER — ONDANSETRON HCL 4 MG/2ML IJ SOLN: 4.0000 mg | Freq: Four times a day (QID) | INTRAMUSCULAR | Status: DC | PRN

## 2017-09-30 MED ORDER — ROCURONIUM 10MG/ML (10ML) SYRINGE FOR MEDFUSION PUMP - OPTIME
INTRAVENOUS | Status: DC | PRN
  Administered 2017-09-30: 5 mg via INTRAVENOUS
  Administered 2017-09-30: 20 mg via INTRAVENOUS

## 2017-09-30 MED ORDER — BUPIVACAINE LIPOSOME 1.3 % IJ SUSP
INTRAMUSCULAR | Status: DC | PRN
  Administered 2017-09-30: 20 mL

## 2017-09-30 MED ORDER — ONDANSETRON HCL 4 MG/2ML IJ SOLN
INTRAMUSCULAR | Status: AC
  Filled 2017-09-30: qty 2

## 2017-09-30 MED ORDER — POVIDONE-IODINE 10 % OINT PACKET
TOPICAL_OINTMENT | CUTANEOUS | Status: DC | PRN
  Administered 2017-09-30: 1 via TOPICAL

## 2017-09-30 MED ORDER — ACETAMINOPHEN 650 MG RE SUPP: 650.0000 mg | Freq: Four times a day (QID) | RECTAL | Status: DC | PRN

## 2017-09-30 MED ORDER — FENTANYL CITRATE (PF) 100 MCG/2ML IJ SOLN
INTRAMUSCULAR | Status: AC
  Filled 2017-09-30: qty 2

## 2017-09-30 MED ORDER — SIMETHICONE 80 MG PO CHEW: 40.0000 mg | CHEWABLE_TABLET | Freq: Four times a day (QID) | ORAL | Status: DC | PRN

## 2017-09-30 MED ORDER — LACTATED RINGERS IV SOLN: INTRAVENOUS | Status: DC

## 2017-09-30 MED ORDER — ACETAMINOPHEN 325 MG PO TABS: 650.0000 mg | ORAL_TABLET | Freq: Four times a day (QID) | ORAL | Status: DC | PRN

## 2017-09-30 MED ORDER — PROPOFOL 10 MG/ML IV BOLUS
INTRAVENOUS | Status: DC | PRN
  Administered 2017-09-30: 150 mg via INTRAVENOUS

## 2017-09-30 MED ORDER — DEXAMETHASONE SODIUM PHOSPHATE 4 MG/ML IJ SOLN
INTRAMUSCULAR | Status: AC
  Filled 2017-09-30: qty 2

## 2017-09-30 MED ORDER — DEXMEDETOMIDINE HCL IN NACL 200 MCG/50ML IV SOLN
INTRAVENOUS | Status: DC | PRN
  Administered 2017-09-30: 20 ug via INTRAVENOUS

## 2017-09-30 MED ORDER — KETOROLAC TROMETHAMINE 30 MG/ML IJ SOLN: 30.0000 mg | Freq: Four times a day (QID) | INTRAMUSCULAR | Status: DC | PRN

## 2017-09-30 MED ORDER — HEMOSTATIC AGENTS (NO CHARGE) OPTIME
TOPICAL | Status: DC | PRN
  Administered 2017-09-30: 1 via TOPICAL

## 2017-09-30 MED ORDER — NEOSTIGMINE METHYLSULFATE 10 MG/10ML IV SOLN
INTRAVENOUS | Status: AC
  Filled 2017-09-30: qty 1

## 2017-09-30 SURGICAL SUPPLY — 60 items
APL SRG 38 LTWT LNG FL B (MISCELLANEOUS) ×1
APPLICATOR ARISTA FLEXITIP XL (MISCELLANEOUS) ×2 IMPLANT
BAG RETRIEVAL 10 (BASKET) ×1
BAG RETRIEVAL 10MM (BASKET) ×1
CHLORAPREP W/TINT 26ML (MISCELLANEOUS) ×3 IMPLANT
CLOTH BEACON ORANGE TIMEOUT ST (SAFETY) ×3 IMPLANT
COVER LIGHT HANDLE STERIS (MISCELLANEOUS) ×6 IMPLANT
CUTTER FLEX LINEAR 45M (STAPLE) ×3 IMPLANT
DECANTER SPIKE VIAL GLASS SM (MISCELLANEOUS) ×3 IMPLANT
DRSG TEGADERM 2-3/8X2-3/4 SM (GAUZE/BANDAGES/DRESSINGS) ×6 IMPLANT
ELECT REM PT RETURN 9FT ADLT (ELECTROSURGICAL) ×3
ELECTRODE REM PT RTRN 9FT ADLT (ELECTROSURGICAL) ×1 IMPLANT
EVACUATOR SMOKE 8.L (FILTER) ×3 IMPLANT
GLOVE BIOGEL M 6.5 STRL (GLOVE) ×2 IMPLANT
GLOVE BIOGEL PI IND STRL 6.5 (GLOVE) ×1 IMPLANT
GLOVE BIOGEL PI IND STRL 7.0 (GLOVE) ×2 IMPLANT
GLOVE BIOGEL PI IND STRL 7.5 (GLOVE) IMPLANT
GLOVE BIOGEL PI INDICATOR 6.5 (GLOVE) ×2
GLOVE BIOGEL PI INDICATOR 7.0 (GLOVE) ×4
GLOVE BIOGEL PI INDICATOR 7.5 (GLOVE) ×2
GLOVE SURG SS PI 7.5 STRL IVOR (GLOVE) ×3 IMPLANT
GOWN STRL REUS W/ TWL XL LVL3 (GOWN DISPOSABLE) ×1 IMPLANT
GOWN STRL REUS W/TWL LRG LVL3 (GOWN DISPOSABLE) ×3 IMPLANT
GOWN STRL REUS W/TWL XL LVL3 (GOWN DISPOSABLE) ×3
HEMOSTAT ARISTA ABSORB 3G PWDR (MISCELLANEOUS) ×2 IMPLANT
INST SET LAPROSCOPIC AP (KITS) ×3 IMPLANT
IV NS IRRIG 3000ML ARTHROMATIC (IV SOLUTION) ×2 IMPLANT
KIT TURNOVER KIT A (KITS) ×3 IMPLANT
MANIFOLD NEPTUNE II (INSTRUMENTS) ×3 IMPLANT
NDL HYPO 18GX1.5 BLUNT FILL (NEEDLE) ×1 IMPLANT
NDL INSUFFLATION 14GA 120MM (NEEDLE) ×1 IMPLANT
NEEDLE HYPO 18GX1.5 BLUNT FILL (NEEDLE) ×3 IMPLANT
NEEDLE HYPO 22GX1.5 SAFETY (NEEDLE) ×3 IMPLANT
NEEDLE INSUFFLATION 14GA 120MM (NEEDLE) ×3 IMPLANT
NS IRRIG 1000ML POUR BTL (IV SOLUTION) ×3 IMPLANT
PACK LAP CHOLE LZT030E (CUSTOM PROCEDURE TRAY) ×3 IMPLANT
PAD ARMBOARD 7.5X6 YLW CONV (MISCELLANEOUS) ×3 IMPLANT
PENCIL HANDSWITCHING (ELECTRODE) ×3 IMPLANT
RELOAD 45 VASCULAR/THIN (ENDOMECHANICALS) ×3 IMPLANT
RELOAD STAPLE 45 2.5 WHT GRN (ENDOMECHANICALS) IMPLANT
RELOAD STAPLE 45 3.5 BLU ETS (ENDOMECHANICALS) IMPLANT
RELOAD STAPLE TA45 3.5 REG BLU (ENDOMECHANICALS) IMPLANT
SET BASIN LINEN APH (SET/KITS/TRAYS/PACK) ×3 IMPLANT
SET TUBE IRRIG SUCTION NO TIP (IRRIGATION / IRRIGATOR) ×3 IMPLANT
SHEARS HARMONIC ACE PLUS 36CM (ENDOMECHANICALS) ×3 IMPLANT
SPONGE GAUZE 2X2 8PLY STER LF (GAUZE/BANDAGES/DRESSINGS) ×3
SPONGE GAUZE 2X2 8PLY STRL LF (GAUZE/BANDAGES/DRESSINGS) ×6 IMPLANT
STAPLER VISISTAT (STAPLE) ×3 IMPLANT
SUT VICRYL 0 UR6 27IN ABS (SUTURE) ×3 IMPLANT
SYR 20CC LL (SYRINGE) ×6 IMPLANT
SYS BAG RETRIEVAL 10MM (BASKET) ×1
SYSTEM BAG RETRIEVAL 10MM (BASKET) ×1 IMPLANT
TRAY FOLEY W/BAG SLVR 16FR (SET/KITS/TRAYS/PACK) ×3
TRAY FOLEY W/BAG SLVR 16FR ST (SET/KITS/TRAYS/PACK) ×1 IMPLANT
TROCAR ENDO BLADELESS 11MM (ENDOMECHANICALS) ×3 IMPLANT
TROCAR ENDO BLADELESS 12MM (ENDOMECHANICALS) ×3 IMPLANT
TROCAR XCEL NON-BLD 5MMX100MML (ENDOMECHANICALS) ×3 IMPLANT
TUBING INSUFFLATION (TUBING) ×3 IMPLANT
WARMER LAPAROSCOPE (MISCELLANEOUS) ×3 IMPLANT
YANKAUER SUCT 12FT TUBE ARGYLE (SUCTIONS) ×3 IMPLANT

## 2017-09-30 NOTE — ED Provider Notes (Signed)
Emergency Department Provider Note   I have reviewed the triage vital signs and the nursing notes.   HISTORY  Chief Complaint Abdominal Pain   HPI Julie Humphrey is a 62 y.o. female with PMH of rectocele and elevated BMI [place] to the emergency department with right lower quadrant abdominal pain radiating across the lower abdomen.  Patient describes more diffuse abdominal discomfort starting 2 days ago with mild nausea.  No vomiting or diarrhea.  Patient states that her pain has intensified and now hurts more in the right lower quadrant.  She denies any fevers or shaking chills.  No UTI symptoms.  No vaginal bleeding or discharge.  No flank pain.  Nuys any chest pain or shortness of breath.  She last ate this morning when she tried to eat a banana which cause no change in symptoms.  No prior surgical history.   Past Medical History:  Diagnosis Date  . Rectocele 03/29/2012    Patient Active Problem List   Diagnosis Date Noted  . Obesity (BMI 30.0-34.9) 10/10/2013  . Ganglion cyst 05/09/2013  . Weight loss due to medication 05/09/2013  . Weight gain 04/04/2013  . Rectocele 03/29/2012    Past Surgical History:  Procedure Laterality Date  . arm surgery Left    paltes and screws  . TONSILLECTOMY    . TUBAL LIGATION Bilateral 1987   Allergies Patient has no known allergies.  Family History  Problem Relation Age of Onset  . Hyperlipidemia Mother   . Hypertension Mother   . Heart attack Father   . Diabetes Father   . Cancer Father        kidney  . CAD Maternal Grandmother   . Heart disease Maternal Grandmother   . CAD Paternal Grandmother   . Diabetes Paternal Grandmother   . Heart disease Paternal Grandmother   . Heart disease Maternal Grandfather   . Diabetes Paternal Grandfather   . Cancer Paternal Grandfather   . Diabetes Paternal Aunt     Social History Social History   Tobacco Use  . Smoking status: Never Smoker  . Smokeless tobacco: Never Used    Substance Use Topics  . Alcohol use: Yes    Alcohol/week: 2.0 standard drinks    Types: 2 Glasses of wine per week  . Drug use: No    Review of Systems  Constitutional: No fever/chills Eyes: No visual changes. ENT: No sore throat. Cardiovascular: Denies chest pain. Respiratory: Denies shortness of breath. Gastrointestinal: Positive RLQ abdominal pain. Positive nausea, no vomiting.  No diarrhea.  No constipation. Genitourinary: Negative for dysuria. Musculoskeletal: Negative for back pain. Skin: Negative for rash. Neurological: Negative for headaches, focal weakness or numbness.  10-point ROS otherwise negative.  ____________________________________________   PHYSICAL EXAM:  VITAL SIGNS: ED Triage Vitals  Enc Vitals Group     BP 09/30/17 1401 130/64     Pulse Rate 09/30/17 1401 74     Resp 09/30/17 1401 (!) 21     Temp 09/30/17 1401 99.1 F (37.3 C)     Temp Source 09/30/17 1401 Oral     SpO2 09/30/17 1401 97 %     Weight 09/30/17 1400 213 lb (96.6 kg)     Height 09/30/17 1400 5\' 6"  (1.676 m)     Pain Score 09/30/17 1358 8   Constitutional: Alert and oriented. Well appearing and in no acute distress. Eyes: Conjunctivae are normal.  Head: Atraumatic. Nose: No congestion/rhinnorhea. Mouth/Throat: Mucous membranes are moist. Neck: No stridor.  Cardiovascular: Normal rate, regular rhythm. Good peripheral circulation. Grossly normal heart sounds.   Respiratory: Normal respiratory effort.  No retractions. Lungs CTAB. Gastrointestinal: Soft with focal RLQ tenderness. Positive Rovsing's sign with mild rebound tenderness. No distention.  Musculoskeletal: No lower extremity tenderness nor edema. No gross deformities of extremities. Neurologic:  Normal speech and language. No gross focal neurologic deficits are appreciated.  Skin:  Skin is warm, dry and intact. No rash noted.  ____________________________________________   LABS (all labs ordered are listed, but only  abnormal results are displayed)  Labs Reviewed  COMPREHENSIVE METABOLIC PANEL - Abnormal; Notable for the following components:      Result Value   Glucose, Bld 114 (*)    Total Bilirubin 2.2 (*)    All other components within normal limits  LIPASE, BLOOD  CBC  URINALYSIS, ROUTINE W REFLEX MICROSCOPIC   ____________________________________________  RADIOLOGY  Ct Abdomen Pelvis W Contrast  Result Date: 09/30/2017 CLINICAL DATA:  Right lower quadrant pain.  Nausea. EXAM: CT ABDOMEN AND PELVIS WITH CONTRAST TECHNIQUE: Multidetector CT imaging of the abdomen and pelvis was performed using the standard protocol following bolus administration of intravenous contrast. CONTRAST:  14mL ISOVUE-300 IOPAMIDOL (ISOVUE-300) INJECTION 61% COMPARISON:  None. FINDINGS: Lower chest: Normal. Hepatobiliary: No focal liver abnormality is seen. Mild nonspecific hepatomegaly. No gallstones, gallbladder wall thickening, or biliary dilatation. Pancreas: Unremarkable. No pancreatic ductal dilatation or surrounding inflammatory changes. Spleen: Normal in size without focal abnormality. Adrenals/Urinary Tract: Adrenal glands are unremarkable. Kidneys are normal, without renal calculi, focal lesion, or hydronephrosis. Bladder is unremarkable. Stomach/Bowel: There is acute appendicitis. Irregular 13 mm appendicolith. Periappendiceal inflammation and appendiceal distention. No visible perforation. There is a small amount of free fluid in the pelvis. Small paraesophageal hernia.  The bowel is otherwise normal. Vascular/Lymphatic: Aortic atherosclerosis. No enlarged abdominal or pelvic lymph nodes. Reproductive: Uterus and bilateral adnexa are unremarkable. Other: No abdominal wall hernia or abnormality. Musculoskeletal: No acute bone abnormality. 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 IMPRESSION: Acute appendicitis. Peer appendiceal soft tissue  inflammation with a small amount of ascites in the pelvic cul-de-sac. Small paraesophageal hiatal hernia. Electronically Signed   By: Lorriane Shire M.D.   On: 09/30/2017 16:03    ____________________________________________   PROCEDURES  Procedure(s) performed:   Procedures  CRITICAL CARE Performed by: Margette Fast Total critical care time: 35 minutes Critical care time was exclusive of separately billable procedures and treating other patients. Critical care was necessary to treat or prevent imminent or life-threatening deterioration. Critical care was time spent personally by me on the following activities: development of treatment plan with patient and/or surrogate as well as nursing, discussions with consultants, evaluation of patient's response to treatment, examination of patient, obtaining history from patient or surrogate, ordering and performing treatments and interventions, ordering and review of laboratory studies, ordering and review of radiographic studies, pulse oximetry and re-evaluation of patient's condition.  Nanda Quinton, MD Emergency Medicine  ____________________________________________   INITIAL IMPRESSION / ASSESSMENT AND PLAN / ED COURSE  Pertinent labs & imaging results that were available during my care of the patient were reviewed by me and considered in my medical decision making (see chart for details).  She presents to the emergency department with right lower quadrant abdominal pain.  Presentation and exam is concerning for acute appendicitis.  Labs pending.  Plan for pain and nausea control while awaiting CT. Patient is HDS.  Differential diagnosis  includes but is not exclusive to ectopic pregnancy, ovarian cyst, ovarian torsion, acute appendicitis, urinary tract infection, endometriosis, bowel obstruction, hernia, colitis, renal colic, gastroenteritis, volvulus etc.  CT and labs reviewed. Patient with acute appendicitis. Started Zosyn. Will discuss  with surgery.   Discussed patient's case with General Surgery, Dr. Arnoldo Morale to request admission. Patient and family (if present) updated with plan. Care transferred to Surgery service.  I reviewed all nursing notes, vitals, pertinent old records, EKGs, labs, imaging (as available).  ____________________________________________  FINAL CLINICAL IMPRESSION(S) / ED DIAGNOSES  Final diagnoses:  Acute appendicitis, unspecified acute appendicitis type     MEDICATIONS GIVEN DURING THIS VISIT:  Medications  0.9 %  sodium chloride infusion (has no administration in time range)  sodium chloride 0.9 % bolus 500 mL (0 mLs Intravenous Stopped 09/30/17 1559)  morphine 4 MG/ML injection 4 mg (4 mg Intravenous Given 09/30/17 1423)  ondansetron (ZOFRAN) injection 4 mg (4 mg Intravenous Given 09/30/17 1423)  iopamidol (ISOVUE-300) 61 % injection 100 mL (100 mLs Intravenous Contrast Given 09/30/17 1530)  piperacillin-tazobactam (ZOSYN) IVPB 3.375 g (3.375 g Intravenous New Bag/Given 09/30/17 1609)    Note:  This document was prepared using Dragon voice recognition software and may include unintentional dictation errors.  Nanda Quinton, MD Emergency Medicine    Long, [facility], MD 09/30/17 [phone]

## 2017-09-30 NOTE — Anesthesia Procedure Notes (Signed)
Procedure Name: Intubation Date/Time: 09/30/2017 5:18 PM Performed by: Nicanor Alcon, MD Pre-anesthesia Checklist: Patient identified, Patient being monitored, Timeout performed, Emergency Drugs available and Suction available Patient Re-evaluated:Patient Re-evaluated prior to induction Oxygen Delivery Method: Circle System Utilized Preoxygenation: Pre-oxygenation with 100% oxygen Induction Type: IV induction Ventilation: Mask ventilation without difficulty Laryngoscope Size: Mac and 3 Grade View: Grade I Tube type: Oral Tube size: 7.0 mm Number of attempts: 1 Airway Equipment and Method: Stylet Placement Confirmation: ETT inserted through vocal cords under direct vision,  positive ETCO2 and breath sounds checked- equal and bilateral Secured at: 21 cm Tube secured with: Tape Dental Injury: Teeth and Oropharynx as per pre-operative assessment

## 2017-09-30 NOTE — Op Note (Signed)
Patient:  Julie Humphrey  DOB:  [birth year]  MRN:  [id number]   Preop Diagnosis: Acute appendicitis  Postop Diagnosis: Same  Procedure: Laparoscopic appendectomy  Surgeon: Aviva Signs, MD  Anes: General endotracheal  Indications: Patient is a 62 year old white female presents with several day history of worsening right lower quadrant abdominal pain.  CT scan of the abdomen reveals acute appendicitis.  The risks and benefits of the procedure including bleeding, infection, and the possibility of an open procedure were fully explained to the patient, who gave informed consent.  Procedure note: The patient was placed in supine position.  After induction of general endotracheal anesthesia, the abdomen was prepped and draped using the usual sterile technique with DuraPrep.  Surgical site confirmation was performed.  A supraumbilical incision was made down to the fascia.  A Veress needle was introduced into the abdominal cavity and confirmation of placement was done using the saline drop test.  The abdomen was then insufflated to 16 mmHg pressure.  An 11 mm trocar was introduced into the abdominal cavity under direct visualization without difficulty.  The patient was placed in deeper Trendelenburg position and an additional 12 mm trocar was placed in the suprapubic region and a 5 mm trocar was placed in the left lower quadrant region.  The distal small bowel was noted to have superficial inflammatory changes present.  In the right lower quadrant, a gangrenous appendix was found.  There was no purulent drainage present.  The mesoappendix was divided using the harmonic scalpel.  A vascular Endo GIA was placed across the base the appendix at its junction to the cecum and fired.  The appendix was then removed using an Endo Catch bag without difficulty.  The pelvis and right lower quadrant was copiously irrigated with normal saline.  Arista was placed along the appendiceal mesentery.  All fluid and air were  then evacuated from the abdominal cavity prior to the removal of the trochars.  All wounds were irrigated with normal saline.  All wounds were injected with Exparel.  The supraumbilical fascia was reapproximated using an 0 Vicryl interrupted suture.  All skin incisions were closed using staples.  Betadine ointment and dry sterile dressings were applied.  All tape and needle counts were correct at the end of the procedure.  The patient was extubated in the operating room and transferred to PACU in stable condition.  Complications: None  EBL: Minimal  Specimen: Appendix

## 2017-09-30 NOTE — Transfer of Care (Signed)
Immediate Anesthesia Transfer of Care Note  Patient: Julie Humphrey  Procedure(s) Performed: APPENDECTOMY LAPAROSCOPIC (N/A )  Patient Location: PACU  Anesthesia Type:General  Level of Consciousness: awake, alert , oriented and patient cooperative  Airway & Oxygen Therapy: Patient Spontanous Breathing and Patient connected to face mask oxygen  Post-op Assessment: Report given to RN, Post -op Vital signs reviewed and stable and Patient moving all extremities  Post vital signs: Reviewed and stable  Last Vitals:  Vitals Value Taken Time  BP    Temp    Pulse 89 09/30/2017  6:14 PM  Resp    SpO2 85 % 09/30/2017  6:14 PM  Vitals shown include unvalidated device data.  Last Pain:  Vitals:   09/30/17 1521  TempSrc:   PainSc: 4          Complications: No apparent anesthesia complications

## 2017-09-30 NOTE — ED Triage Notes (Signed)
Pt c/o rlq pain  With radiation across x 2 days. Nausea. Denies v/d. lnbm today. Denies gu sx. Grimacing and moaning.

## 2017-09-30 NOTE — Anesthesia Postprocedure Evaluation (Signed)
Anesthesia Post Note  Patient: Julie Humphrey  Procedure(s) Performed: APPENDECTOMY LAPAROSCOPIC (N/A )  Patient location during evaluation: PACU Anesthesia Type: General Level of consciousness: awake and alert Pain management: pain level controlled Vital Signs Assessment: post-procedure vital signs reviewed and stable Respiratory status: spontaneous breathing, nonlabored ventilation, respiratory function stable and patient connected to nasal cannula oxygen Cardiovascular status: blood pressure returned to baseline and stable Postop Assessment: no apparent nausea or vomiting Anesthetic complications: no     Last Vitals:  Vitals:   09/30/17 1530 09/30/17 1600  BP: (!) 130/105 133/87  Pulse: 85 77  Resp: 16 16  Temp:    SpO2: 93% 99%    Last Pain:  Vitals:   09/30/17 1521  TempSrc:   PainSc: Eva

## 2017-09-30 NOTE — H&P (Signed)
Julie Humphrey is an 62 y.o. female.   Chief Complaint: Right lower quadrant abdominal pain HPI: Patient is a 62 year old white female who presented to the emergency room with a 2-day history of worsening right lower quadrant abdominal pain.  CT scan of the abdomen reveals acute appendicitis.  Past Medical History:  Diagnosis Date  . Rectocele 03/29/2012    Past Surgical History:  Procedure Laterality Date  . arm surgery Left    paltes and screws  . TONSILLECTOMY    . TUBAL LIGATION Bilateral 1987    Family History  Problem Relation Age of Onset  . Hyperlipidemia Mother   . Hypertension Mother   . Heart attack Father   . Diabetes Father   . Cancer Father        kidney  . CAD Maternal Grandmother   . Heart disease Maternal Grandmother   . CAD Paternal Grandmother   . Diabetes Paternal Grandmother   . Heart disease Paternal Grandmother   . Heart disease Maternal Grandfather   . Diabetes Paternal Grandfather   . Cancer Paternal Grandfather   . Diabetes Paternal Aunt    Social History:  reports that she has never smoked. She has never used smokeless tobacco. She reports that she drinks about 2.0 standard drinks of alcohol per week. She reports that she does not use drugs.  Allergies: No Known Allergies   (Not in a hospital admission)  Results for orders placed or performed during the hospital encounter of 09/30/17 (from the past 48 hour(s))  Lipase, blood     Status: None   Collection Time: 09/30/17  2:03 PM  Result Value Ref Range   Lipase 25 11 - 51 U/L    Comment: Performed at [facility], [address], [place], [place] [address]  Comprehensive metabolic panel     Status: Abnormal   Collection Time: 09/30/17  2:03 PM  Result Value Ref Range   Sodium 139 135 - 145 mmol/L   Potassium 4.1 3.5 - 5.1 mmol/L   Chloride 103 98 - 111 mmol/L   CO2 28 22 - 32 mmol/L   Glucose, Bld 114 (H) 70 - 99 mg/dL   BUN 12 8 - 23 mg/dL   Creatinine, Ser 0.95 0.44 - 1.00 mg/dL   Calcium 9.6 8.9 - 10.3 mg/dL   Total Protein 7.7 6.5 - 8.1 g/dL   Albumin 4.1 3.5 - 5.0 g/dL   AST 24 15 - 41 U/L   ALT 26 0 - 44 U/L   Alkaline Phosphatase 56 38 - 126 U/L   Total Bilirubin 2.2 (H) 0.3 - 1.2 mg/dL   GFR calc non Af Amer >60 >60 mL/min   GFR calc Af Amer >60 >60 mL/min    Comment: (NOTE) The eGFR has been calculated using the CKD EPI equation. This calculation has not been validated in all clinical situations. eGFR's persistently <60 mL/min signify possible Chronic Kidney Disease.    Anion gap 8 5 - 15    Comment: Performed at [facility], [address] [place], [place] [address]  CBC     Status: None   Collection Time: 09/30/17  2:03 PM  Result Value Ref Range   WBC 6.9 4.0 - 10.5 K/uL   RBC 4.65 3.87 - 5.11 MIL/uL   Hemoglobin 14.4 12.0 - 15.0 g/dL   HCT 42.1 36.0 - 46.0 %   MCV 90.5 78.0 - 100.0 fL   MCH 31.0 26.0 - 34.0 pg   MCHC 34.2 30.0 -  36.0 g/dL   RDW 13.4 11.5 - 15.5 %   Platelets 192 150 - 400 K/uL    Comment: Performed at [facility], [address], [place], [place] [address]  Urinalysis, Routine w reflex microscopic     Status: None   Collection Time: 09/30/17  2:03 PM  Result Value Ref Range   Color, Urine YELLOW YELLOW   APPearance CLEAR CLEAR   Specific Gravity, Urine 1.009 1.005 - 1.030   pH 6.0 5.0 - 8.0   Glucose, UA NEGATIVE NEGATIVE mg/dL   Hgb urine dipstick NEGATIVE NEGATIVE   Bilirubin Urine NEGATIVE NEGATIVE   Ketones, ur NEGATIVE NEGATIVE mg/dL   Protein, ur NEGATIVE NEGATIVE mg/dL   Nitrite NEGATIVE NEGATIVE   Leukocytes, UA NEGATIVE NEGATIVE    Comment: Performed at [facility], [address] [place], [place] [address]   Ct Abdomen Pelvis W Contrast  Result Date: 09/30/2017 CLINICAL DATA:  Right lower quadrant pain.  Nausea. EXAM: CT ABDOMEN AND PELVIS WITH CONTRAST TECHNIQUE: Multidetector CT imaging of the abdomen and pelvis was performed using the standard protocol following bolus administration of intravenous  contrast. CONTRAST:  136m ISOVUE-300 IOPAMIDOL (ISOVUE-300) INJECTION 61% COMPARISON:  None. FINDINGS: Lower chest: Normal. Hepatobiliary: No focal liver abnormality is seen. Mild nonspecific hepatomegaly. No gallstones, gallbladder wall thickening, or biliary dilatation. Pancreas: Unremarkable. No pancreatic ductal dilatation or surrounding inflammatory changes. Spleen: Normal in size without focal abnormality. Adrenals/Urinary Tract: Adrenal glands are unremarkable. Kidneys are normal, without renal calculi, focal lesion, or hydronephrosis. Bladder is unremarkable. Stomach/Bowel: There is acute appendicitis. Irregular 13 mm appendicolith. Periappendiceal inflammation and appendiceal distention. No visible perforation. There is a small amount of free fluid in the pelvis. Small paraesophageal hernia.  The bowel is otherwise normal. Vascular/Lymphatic: Aortic atherosclerosis. No enlarged abdominal or pelvic lymph nodes. Reproductive: Uterus and bilateral adnexa are unremarkable. Other: No abdominal wall hernia or abnormality. Musculoskeletal: No acute bone abnormality. 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 IMPRESSION: Acute appendicitis. Peer appendiceal soft tissue inflammation with a small amount of ascites in the pelvic cul-de-sac. Small paraesophageal hiatal hernia. Electronically Signed   By: JLorriane ShireM.D.   On: 09/30/2017 16:03    Review of Systems  Constitutional: Positive for malaise/fatigue.  HENT: Negative.   Eyes: Negative.   Respiratory: Negative.   Cardiovascular: Negative.   Gastrointestinal: Positive for abdominal pain.  Genitourinary: Negative.   Musculoskeletal: Negative.   Skin: Negative.   Neurological: Negative.   Endo/Heme/Allergies: Negative.   Psychiatric/Behavioral: Negative.     Blood pressure 133/87, pulse 77, temperature 99.1 F (37.3 C), temperature source Oral, resp. rate 16,  height 5' 6"  (1.676 m), weight 96.6 kg, last menstrual period 10/30/2006, SpO2 99 %. Physical Exam  Vitals reviewed. Constitutional: She is oriented to person, place, and time. She appears well-developed and well-nourished. No distress.  HENT:  Head: Normocephalic and atraumatic.  Cardiovascular: Normal rate, regular rhythm and normal heart sounds. Exam reveals no gallop and no friction rub.  No murmur heard. Respiratory: Effort normal and breath sounds normal. No respiratory distress. She has no wheezes. She has no rales.  GI: Soft. Bowel sounds are normal. She exhibits no distension. There is tenderness. There is no rebound and no guarding.  Tender in the right lower quadrant to palpation.  No rigidity noted.  Neurological: She is alert and oriented to person, place, and time.  Skin: Skin is warm and dry.  CT scan reviewed Assessment/Plan Impression: Acute appendicitis Plan: Patient be taken to the operating room for laparoscopic appendectomy.  The risks and benefits of the procedure including bleeding, infection, and the possibility of an open procedure were fully explained to the patient, who gave informed consent.  Aviva Signs, MD 09/30/2017, 4:12 PM

## 2017-09-30 NOTE — ED Notes (Signed)
Patient transported to OR.

## 2017-09-30 NOTE — Anesthesia Preprocedure Evaluation (Signed)
Anesthesia Evaluation  Patient identified by MRN, date of birth, ID band Patient awake    Reviewed: Allergy & Precautions, H&P , NPO status , Patient's Chart, lab work & pertinent test results  Airway Mallampati: II  TM Distance: >3 FB Neck ROM: full    Dental no notable dental hx.    Pulmonary neg pulmonary ROS,    Pulmonary exam normal breath sounds clear to auscultation       Cardiovascular Exercise Tolerance: Good negative cardio ROS   Rhythm:regular Rate:Normal     Neuro/Psych negative neurological ROS  negative psych ROS   GI/Hepatic negative GI ROS, Neg liver ROS,   Endo/Other  negative endocrine ROS  Renal/GU negative Renal ROS  negative genitourinary   Musculoskeletal   Abdominal   Peds  Hematology negative hematology ROS (+)   Anesthesia Other Findings   Reproductive/Obstetrics negative OB ROS                             Anesthesia Physical Anesthesia Plan  ASA: II and emergent  Anesthesia Plan: General   Post-op Pain Management:    Induction:   PONV Risk Score and Plan:   Airway Management Planned:   Additional Equipment:   Intra-op Plan:   Post-operative Plan:   Informed Consent: I have reviewed the patients History and Physical, chart, labs and discussed the procedure including the risks, benefits and alternatives for the proposed anesthesia with the patient or authorized representative who has indicated his/her understanding and acceptance.   Dental Advisory Given  Plan Discussed with: CRNA  Anesthesia Plan Comments:         Anesthesia Quick Evaluation

## 2017-10-01 LAB — BASIC METABOLIC PANEL
Anion gap: 8 (ref 5–15)
BUN: 15 mg/dL (ref 8–23)
CALCIUM: 8.6 mg/dL — AB (ref 8.9–10.3)
CO2: 24 mmol/L (ref 22–32)
CREATININE: 1.09 mg/dL — AB (ref 0.44–1.00)
Chloride: 105 mmol/L (ref 98–111)
GFR calc Af Amer: >60 mL/min (ref >60)
GFR, EST NON AFRICAN AMERICAN: 52 mL/min — AB (ref >60)
Glucose, Bld: 166 mg/dL — ABNORMAL HIGH (ref 70–99)
POTASSIUM: 4.6 mmol/L (ref 3.5–5.1)
SODIUM: 137 mmol/L (ref 135–145)

## 2017-10-01 LAB — CBC
HCT: 38.8 % (ref 36.0–46.0)
Hemoglobin: 12.9 g/dL (ref 12.0–15.0)
MCH: 30.3 pg (ref 26.0–34.0)
MCHC: 33.2 g/dL (ref 30.0–36.0)
MCV: 91.1 fL (ref 78.0–100.0)
PLATELETS: 194 10*3/uL (ref 150–400)
RBC: 4.26 MIL/uL (ref 3.87–5.11)
RDW: 13.4 % (ref 11.5–15.5)
WBC: 10.3 10*3/uL (ref 4.0–10.5)

## 2017-10-01 MED ORDER — CIPROFLOXACIN HCL 500 MG PO TABS: 500.0000 mg | ORAL_TABLET | Freq: Two times a day (BID) | ORAL | 0 refills | Status: DC

## 2017-10-01 NOTE — Progress Notes (Signed)
IV removed and discharge instructions reviewed.  Script sent to pharmacy.

## 2017-10-01 NOTE — Discharge Summary (Signed)
Physician Discharge Summary  Patient ID: Julie Humphrey MRN: [id number] DOB/AGE: 64/09/1955 64 y.o.  Admit date: 09/30/2017 Discharge date: 10/01/2017  Admission Diagnoses: Acute appendicitis  Discharge Diagnoses: Same Active Problems:   Acute appendicitis   S/P laparoscopic appendectomy   Discharged Condition: good  Hospital Course: Patient is a 62 year old white female who presented to the emergency room with worsening right lower quadrant abdominal pain.  CT scan of the abdomen revealed acute appendicitis.  Patient was taken to the operating room on 09/30/2017 and underwent a laparoscopic appendectomy.  She tolerated the surgery well.  Her postoperative course has been unremarkable.  Her diet was advanced without difficulty.  Patient is being discharged home on 10/01/2017 in good and improving condition.  Treatments: surgery: Laparoscopic appendectomy on 09/30/2017  Discharge Exam: Blood pressure (!) 105/56, pulse (!) 58, temperature 97.8 F (36.6 C), temperature source Oral, resp. rate 20, height 5\' 6"  (1.676 m), weight 96.6 kg, last menstrual period 10/30/2006, SpO2 98 %. General appearance: alert, cooperative and no distress Resp: clear to auscultation bilaterally Cardio: regular rate and rhythm, S1, S2 normal, no murmur, click, rub or gallop GI: Soft, incisions healing well.  Disposition: Discharge disposition: 01-Home or Self Care       Discharge Instructions    Diet - low sodium heart healthy   Complete by:  As directed    Increase activity slowly   Complete by:  As directed      Allergies as of 10/01/2017   No Known Allergies     Medication List    TAKE these medications   calcium carbonate 600 MG Tabs tablet Commonly known as:  OS-CAL Take 600 mg by mouth 2 (two) times daily with a meal.   cholecalciferol 1000 units tablet Commonly known as:  VITAMIN D Take 1,000 Units by mouth daily.   ciprofloxacin 500 MG tablet Commonly known as:  CIPRO Take 1 tablet  (500 mg total) by mouth 2 (two) times daily.   Coenzyme Q10 200 MG capsule Take 200 mg by mouth daily.   COSAMIN DS PO Take by mouth.   Evening Primrose Oil 500 MG Caps Take by mouth.   KRILL OIL OMEGA-3 PO Take by mouth.   phentermine 30 MG capsule Take 1 capsule (30 mg total) by mouth every morning.   tretinoin 0.025 % cream Commonly known as:  RETIN-A Apply topically at bedtime.   Turmeric Curcumin Caps Take 4 capsules by mouth daily.   vitamin C 500 MG tablet Commonly known as:  ASCORBIC ACID Take 500 mg by mouth daily.   vitamin E 400 UNIT capsule Take 400 Units by mouth daily.   zinc gluconate 50 MG tablet Take 50 mg by mouth daily.      Follow-up Information    Aviva Signs, MD. Schedule an appointment as soon as possible for a visit on 10/11/2017.   Specialty:  General Surgery Contact information: [id number] [place] [address] [phone]           Signed: Aviva Signs 10/01/2017, 8:54 AM

## 2017-10-01 NOTE — Discharge Instructions (Signed)
Laparoscopic Appendectomy, Adult, Care After °Refer to this sheet in the next few weeks. These instructions provide you with information about caring for yourself after your procedure. Your health care provider may also give you more specific instructions. Your treatment has been planned according to current medical practices, but problems sometimes occur. Call your health care provider if you have any problems or questions after your procedure. °What can I expect after the procedure? °After the procedure, it is common to have: °· A decrease in your energy level. °· Mild pain in the area where the surgical cuts (incisions) were made. °· Constipation. This can be caused by pain medicine and a decrease in your activity. ° °Follow these instructions at home: °Medicines °· Take over-the-counter and prescription medicines only as told by your health care provider. °· Do not drive for 24 hours if you received a sedative. °· Do not drive or operate heavy machinery while taking prescription pain medicine. °· If you were prescribed an antibiotic medicine, take it as told by your health care provider. Do not stop taking the antibiotic even if you start to feel better. °Activity °· For 3 weeks or as long as told by your health care provider: °? Do not lift anything that is heavier than 10 pounds (4.5 kg). °? Do not play contact sports. °· Gradually return to your normal activities. Ask your health care provider what activities are safe for you. °Bathing °· Keep your incisions clean and dry. Clean them as often as told by your health care provider: °? Gently wash the incisions with soap and water. °? Rinse the incisions with water to remove all soap. °? Pat the incisions dry with a clean towel. Do not rub the incisions. °· You may take showers after 48 hours. °· Do not take baths, swim, or use hot tubs for 2 weeks or as told by your health care provider. °Incision care °· Follow instructions from your healthcare provider about  how to take care of your incisions. Make sure you: °? Wash your hands with soap and water before you change your bandage (dressing). If soap and water are not available, use hand sanitizer. °? Change your dressing as told by your health care provider. °? Leave stitches (sutures), skin glue, or adhesive strips in place. These skin closures may need to stay in place for 2 weeks or longer. If adhesive strip edges start to loosen and curl up, you may trim the loose edges. Do not remove adhesive strips completely unless your health care provider tells you to do that. °· Check your incision areas every day for signs of infection. Check for: °? More redness, swelling, or pain. °? More fluid or blood. °? Warmth. °? Pus or a bad smell. °Other Instructions °· If you were sent home with a drain, follow instructions from your health care provider about how to care for the drain and how to empty it. °· Take deep breaths. This helps to prevent your lungs from becoming inflamed. °· To relieve and prevent constipation: °? Drink plenty of fluids. °? Eat plenty of fruits and vegetables. °· Keep all follow-up visits as told by your health care provider. This is important. °Contact a health care provider if: °· You have more redness, swelling, or pain around an incision. °· You have more fluid or blood coming from an incision. °· Your incision feels warm to the touch. °· You have pus or a bad smell coming from an incision or dressing. °· Your incision   edges break open after your sutures have been removed. °· You have increasing pain in your shoulders. °· You feel dizzy or you faint. °· You develop shortness of breath. °· You keep feeling nauseous or vomiting. °· You have diarrhea or you cannot control your bowel functions. °· You lose your appetite. °· You develop swelling or pain in your legs. °Get help right away if: °· You have a fever. °· You develop a rash. °· You have difficulty breathing. °· You have sharp pains in your  chest. °This information is not intended to replace advice given to you by your health care provider. Make sure you discuss any questions you have with your health care provider. °Document Released: 12/28/2004 Document Revised: 05/30/2015 Document Reviewed: 06/17/2014 °Elsevier Interactive Patient Education © 2018 Elsevier Inc. ° °

## 2017-10-03 ENCOUNTER — Encounter (HOSPITAL_COMMUNITY): Payer: Self-pay | Admitting: General Surgery

## 2017-10-03 NOTE — Addendum Note (Signed)
Addendum  created 10/03/17 0746 by Vista Deck, CRNA   Charge Capture section accepted

## 2017-10-13 ENCOUNTER — Ambulatory Visit (INDEPENDENT_AMBULATORY_CARE_PROVIDER_SITE_OTHER): Payer: Self-pay | Admitting: General Surgery

## 2017-10-13 ENCOUNTER — Encounter: Payer: Self-pay | Admitting: General Surgery

## 2017-10-13 VITALS — BP 156/102 | HR 81 | Temp 97.1°F | Resp 18 | Wt 207.0 lb

## 2017-10-13 DIAGNOSIS — Z09 Encounter for follow-up examination after completed treatment for conditions other than malignant neoplasm: Secondary | ICD-10-CM

## 2017-10-13 MED ORDER — FLUCONAZOLE 100 MG PO TABS: 100.0000 mg | ORAL_TABLET | Freq: Once | ORAL | 0 refills | Status: AC

## 2017-10-13 NOTE — Progress Notes (Signed)
Subjective:     Julie Humphrey  Status post laparoscopic appendectomy.  Patient doing well.  She was having multiple bowel movements in the morning, but this seems to have resolved.  She did have a truncal rash which is partially resolving.  She was on ciprofloxacin postoperatively due to a significantly inflamed appendix.  She denies any fever or chills.  She is pleased with the results. Objective:    BP (!) 156/102 (BP Location: Left Arm, Patient Position: Sitting, Cuff Size: Normal)   Pulse 81   Temp (!) 97.1 F (36.2 C) (Temporal)   Resp 18   Wt 207 lb (93.9 kg)   LMP 10/30/2006   BMI 33.41 kg/m   General:  alert, cooperative and no distress  Abdomen soft, incisions healing well.  Staples removed, Steri-Strips applied.  Petechial rash noted just below the breast line. Final pathology consistent with diagnosis.     Assessment:    Doing well postoperatively.    Plan:   Diflucan 100 mg p.o. x1 prescribed.  Patient instructed to follow-up with me as needed.  May return to normal activity.

## 2017-11-03 NOTE — Addendum Note (Signed)
Addendum  created 11/03/17 1028 by Vista Deck, CRNA   Intraprocedure Staff edited

## 2018-01-24 ENCOUNTER — Other Ambulatory Visit (HOSPITAL_COMMUNITY): Payer: Self-pay | Admitting: Pulmonary Disease

## 2018-01-24 DIAGNOSIS — Z1231 Encounter for screening mammogram for malignant neoplasm of breast: Secondary | ICD-10-CM

## 2018-02-13 ENCOUNTER — Encounter (HOSPITAL_COMMUNITY): Payer: Self-pay

## 2018-02-13 ENCOUNTER — Ambulatory Visit (HOSPITAL_COMMUNITY)
Admission: RE | Admit: 2018-02-13 | Discharge: 2018-02-13 | Disposition: A | Discharge status: Home / Self Care | Payer: Medicare Other | Source: Ambulatory Visit | Attending: Pulmonary Disease | Admitting: Pulmonary Disease

## 2018-02-13 DIAGNOSIS — Z1231 Encounter for screening mammogram for malignant neoplasm of breast: Secondary | ICD-10-CM

## 2018-02-14 ENCOUNTER — Other Ambulatory Visit (HOSPITAL_COMMUNITY): Payer: Self-pay | Admitting: Pulmonary Disease

## 2018-02-14 DIAGNOSIS — N6453 Retraction of nipple: Secondary | ICD-10-CM

## 2018-03-07 ENCOUNTER — Ambulatory Visit (HOSPITAL_COMMUNITY): Payer: Medicare Other

## 2018-03-07 ENCOUNTER — Ambulatory Visit (HOSPITAL_COMMUNITY)
Admission: RE | Admit: 2018-03-07 | Discharge: 2018-03-07 | Disposition: A | Discharge status: Home / Self Care | Payer: Medicare Other | Source: Ambulatory Visit | Attending: Pulmonary Disease | Admitting: Pulmonary Disease

## 2018-03-07 DIAGNOSIS — N6453 Retraction of nipple: Secondary | ICD-10-CM | POA: Diagnosis not present

## 2018-03-07 DIAGNOSIS — R928 Other abnormal and inconclusive findings on diagnostic imaging of breast: Secondary | ICD-10-CM | POA: Diagnosis not present

## 2018-03-07 DIAGNOSIS — N6489 Other specified disorders of breast: Secondary | ICD-10-CM | POA: Diagnosis not present

## 2018-03-07 LAB — HM MAMMOGRAPHY: HM Mammogram: ABNORMAL — AB (ref 0–4)

## 2018-03-27 DIAGNOSIS — Z1283 Encounter for screening for malignant neoplasm of skin: Secondary | ICD-10-CM | POA: Diagnosis not present

## 2018-03-27 DIAGNOSIS — L570 Actinic keratosis: Secondary | ICD-10-CM | POA: Diagnosis not present

## 2018-03-27 DIAGNOSIS — D225 Melanocytic nevi of trunk: Secondary | ICD-10-CM | POA: Diagnosis not present

## 2018-03-27 DIAGNOSIS — L820 Inflamed seborrheic keratosis: Secondary | ICD-10-CM | POA: Diagnosis not present

## 2018-03-27 DIAGNOSIS — X32XXXA Exposure to sunlight, initial encounter: Secondary | ICD-10-CM | POA: Diagnosis not present

## 2018-03-28 DIAGNOSIS — E785 Hyperlipidemia, unspecified: Secondary | ICD-10-CM | POA: Diagnosis not present

## 2018-03-28 DIAGNOSIS — Z0000 Encounter for general adult medical examination without abnormal findings: Secondary | ICD-10-CM | POA: Diagnosis not present

## 2018-03-28 DIAGNOSIS — E7800 Pure hypercholesterolemia, unspecified: Secondary | ICD-10-CM | POA: Diagnosis not present

## 2018-03-28 DIAGNOSIS — M25519 Pain in unspecified shoulder: Secondary | ICD-10-CM | POA: Diagnosis not present

## 2018-03-28 LAB — COMPREHENSIVE METABOLIC PANEL
Albumin: 4.2 (ref 3.5–5.0)
Calcium: 9.5 (ref 8.7–10.7)
GFR calc Af Amer: 71
GFR calc non Af Amer: 61
Globulin: 2.5

## 2018-03-28 LAB — CBC: RBC: 4.96 (ref 3.87–5.11)

## 2018-03-28 LAB — BASIC METABOLIC PANEL
BUN: 17 (ref 4–21)
CO2: 27 — AB (ref 13–22)
Chloride: 106 (ref 99–108)
Creatinine: 1 (ref <=1.1)
Glucose: 84
Potassium: 4.7 (ref 3.4–5.3)
Sodium: 141 (ref 137–147)

## 2018-03-28 LAB — HEPATIC FUNCTION PANEL
ALT: 15 (ref 7–35)
AST: 17 (ref 13–35)
Alkaline Phosphatase: 50 (ref 25–125)
Bilirubin, Total: 1.3

## 2018-03-28 LAB — CBC AND DIFFERENTIAL
HCT: 44 (ref 36–46)
Hemoglobin: 15 (ref 12.0–16.0)
Platelets: 273 (ref 150–399)

## 2018-08-09 ENCOUNTER — Ambulatory Visit (HOSPITAL_COMMUNITY): Payer: Medicare Other

## 2018-08-09 ENCOUNTER — Other Ambulatory Visit (HOSPITAL_COMMUNITY): Payer: Self-pay | Admitting: Pulmonary Disease

## 2018-08-09 DIAGNOSIS — Z780 Asymptomatic menopausal state: Secondary | ICD-10-CM

## 2018-08-09 DIAGNOSIS — E038 Other specified hypothyroidism: Secondary | ICD-10-CM | POA: Diagnosis not present

## 2018-08-09 DIAGNOSIS — M25519 Pain in unspecified shoulder: Secondary | ICD-10-CM | POA: Diagnosis not present

## 2018-08-09 DIAGNOSIS — Z0000 Encounter for general adult medical examination without abnormal findings: Secondary | ICD-10-CM | POA: Diagnosis not present

## 2018-08-09 LAB — TSH: TSH: 1.69 (ref 0.41–5.90)

## 2018-08-10 ENCOUNTER — Ambulatory Visit (HOSPITAL_COMMUNITY)
Admission: RE | Admit: 2018-08-10 | Discharge: 2018-08-10 | Disposition: A | Discharge status: Home / Self Care | Payer: Medicare Other | Source: Ambulatory Visit | Attending: Pulmonary Disease | Admitting: Pulmonary Disease

## 2018-08-10 ENCOUNTER — Other Ambulatory Visit: Payer: Self-pay

## 2018-08-10 DIAGNOSIS — M85851 Other specified disorders of bone density and structure, right thigh: Secondary | ICD-10-CM | POA: Diagnosis not present

## 2018-08-10 DIAGNOSIS — Z780 Asymptomatic menopausal state: Secondary | ICD-10-CM | POA: Insufficient documentation

## 2018-08-10 LAB — HM DEXA SCAN

## 2018-08-15 ENCOUNTER — Encounter (INDEPENDENT_AMBULATORY_CARE_PROVIDER_SITE_OTHER): Payer: Self-pay | Admitting: *Deleted

## 2018-08-16 ENCOUNTER — Other Ambulatory Visit (HOSPITAL_COMMUNITY): Payer: Medicare Other

## 2018-09-11 ENCOUNTER — Other Ambulatory Visit (INDEPENDENT_AMBULATORY_CARE_PROVIDER_SITE_OTHER): Payer: Self-pay | Admitting: *Deleted

## 2018-09-11 DIAGNOSIS — Z1211 Encounter for screening for malignant neoplasm of colon: Secondary | ICD-10-CM | POA: Insufficient documentation

## 2018-09-11 DIAGNOSIS — Z0000 Encounter for general adult medical examination without abnormal findings: Secondary | ICD-10-CM | POA: Insufficient documentation

## 2018-09-11 LAB — HM COLONOSCOPY

## 2018-11-14 ENCOUNTER — Other Ambulatory Visit (INDEPENDENT_AMBULATORY_CARE_PROVIDER_SITE_OTHER): Payer: Self-pay | Admitting: *Deleted

## 2018-11-14 ENCOUNTER — Telehealth (INDEPENDENT_AMBULATORY_CARE_PROVIDER_SITE_OTHER): Payer: Self-pay | Admitting: *Deleted

## 2018-11-14 ENCOUNTER — Encounter (INDEPENDENT_AMBULATORY_CARE_PROVIDER_SITE_OTHER): Payer: Self-pay | Admitting: *Deleted

## 2018-11-14 DIAGNOSIS — Z1211 Encounter for screening for malignant neoplasm of colon: Secondary | ICD-10-CM

## 2018-11-14 MED ORDER — PEG 3350-KCL-NA BICARB-NACL 420 G PO SOLR: 4000.0000 mL | Freq: Once | ORAL | 0 refills | Status: AC

## 2018-11-14 NOTE — Telephone Encounter (Signed)
Patient needs trilyte TCS sch'd 12/9

## 2018-11-22 ENCOUNTER — Telehealth (INDEPENDENT_AMBULATORY_CARE_PROVIDER_SITE_OTHER): Payer: Self-pay | Admitting: *Deleted

## 2018-11-22 ENCOUNTER — Other Ambulatory Visit: Payer: Self-pay

## 2018-11-22 ENCOUNTER — Ambulatory Visit (INDEPENDENT_AMBULATORY_CARE_PROVIDER_SITE_OTHER): Payer: Self-pay

## 2018-11-22 ENCOUNTER — Other Ambulatory Visit (INDEPENDENT_AMBULATORY_CARE_PROVIDER_SITE_OTHER): Payer: Self-pay | Admitting: *Deleted

## 2018-11-22 NOTE — Telephone Encounter (Signed)
Referring MD/PCP: hawkins   Procedure: tcs  Reason/Indication:  screening  Has patient had this procedure before?  Yes, 2008  If so, when, by whom and where?    Is there a family history of colon cancer?  no  Who?  What age when diagnosed?    Is patient diabetic?   no      Does patient have prosthetic heart valve or mechanical valve?  no  Do you have a pacemaker/defibrillator?  no  Has patient ever had endocarditis/atrial fibrillation? no  Does patient use oxygen? no  Has patient had joint replacement within last 12 months?  no  Is patient constipated or do they take laxatives? no  Does patient have a history of alcohol/drug use?  no  Is patient on blood thinner such as Coumadin, Plavix and/or Aspirin? no  Medications: turneric daily, flax seed oil daily, zinc daily, mega red krill oil daily, biotin daily, CoQ 10 daily  Allergies: nkda  Medication Adjustment per Dr Laural Golden:   Procedure date & time: 12/20/18 at 830

## 2018-11-26 NOTE — Telephone Encounter (Signed)
Okay to schedule colonoscopy with conscious sedation 

## 2018-12-18 ENCOUNTER — Other Ambulatory Visit (HOSPITAL_COMMUNITY)
Admission: RE | Admit: 2018-12-18 | Discharge: 2018-12-18 | Disposition: A | Discharge status: Home / Self Care | Payer: Medicare Other | Source: Ambulatory Visit | Attending: Internal Medicine | Admitting: Internal Medicine

## 2018-12-18 ENCOUNTER — Other Ambulatory Visit: Payer: Self-pay

## 2018-12-18 DIAGNOSIS — Z01812 Encounter for preprocedural laboratory examination: Secondary | ICD-10-CM | POA: Insufficient documentation

## 2018-12-18 DIAGNOSIS — Z20828 Contact with and (suspected) exposure to other viral communicable diseases: Secondary | ICD-10-CM | POA: Diagnosis not present

## 2018-12-18 LAB — SARS CORONAVIRUS 2 (TAT 6-24 HRS): SARS Coronavirus 2: NEGATIVE

## 2018-12-20 ENCOUNTER — Encounter (HOSPITAL_COMMUNITY)
Admission: RE | Disposition: A | Discharge status: Home / Self Care | Payer: Self-pay | Source: Ambulatory Visit | Attending: Internal Medicine

## 2018-12-20 ENCOUNTER — Other Ambulatory Visit: Payer: Self-pay

## 2018-12-20 ENCOUNTER — Encounter (HOSPITAL_COMMUNITY): Payer: Self-pay | Admitting: *Deleted

## 2018-12-20 ENCOUNTER — Ambulatory Visit (HOSPITAL_COMMUNITY)
Admission: RE | Admit: 2018-12-20 | Discharge: 2018-12-20 | Disposition: A | Discharge status: Home / Self Care | Payer: Medicare Other | Source: Ambulatory Visit | Attending: Internal Medicine | Admitting: Internal Medicine

## 2018-12-20 DIAGNOSIS — Z79899 Other long term (current) drug therapy: Secondary | ICD-10-CM | POA: Insufficient documentation

## 2018-12-20 DIAGNOSIS — K922 Gastrointestinal hemorrhage, unspecified: Secondary | ICD-10-CM | POA: Insufficient documentation

## 2018-12-20 DIAGNOSIS — D123 Benign neoplasm of transverse colon: Secondary | ICD-10-CM | POA: Insufficient documentation

## 2018-12-20 DIAGNOSIS — Z90710 Acquired absence of both cervix and uterus: Secondary | ICD-10-CM | POA: Insufficient documentation

## 2018-12-20 DIAGNOSIS — Z8051 Family history of malignant neoplasm of kidney: Secondary | ICD-10-CM | POA: Insufficient documentation

## 2018-12-20 DIAGNOSIS — K648 Other hemorrhoids: Secondary | ICD-10-CM | POA: Diagnosis not present

## 2018-12-20 DIAGNOSIS — K644 Residual hemorrhoidal skin tags: Secondary | ICD-10-CM | POA: Diagnosis not present

## 2018-12-20 DIAGNOSIS — Z833 Family history of diabetes mellitus: Secondary | ICD-10-CM | POA: Diagnosis not present

## 2018-12-20 DIAGNOSIS — Z1211 Encounter for screening for malignant neoplasm of colon: Secondary | ICD-10-CM | POA: Insufficient documentation

## 2018-12-20 DIAGNOSIS — Z0000 Encounter for general adult medical examination without abnormal findings: Secondary | ICD-10-CM | POA: Insufficient documentation

## 2018-12-20 DIAGNOSIS — Z8249 Family history of ischemic heart disease and other diseases of the circulatory system: Secondary | ICD-10-CM | POA: Insufficient documentation

## 2018-12-20 DIAGNOSIS — Z809 Family history of malignant neoplasm, unspecified: Secondary | ICD-10-CM | POA: Insufficient documentation

## 2018-12-20 HISTORY — PX: POLYPECTOMY: SHX5525

## 2018-12-20 HISTORY — PX: COLONOSCOPY: SHX5424

## 2018-12-20 SURGERY — COLONOSCOPY
Anesthesia: Moderate Sedation

## 2018-12-20 MED ORDER — BENEFIBER DRINK MIX PO PACK: 4.0000 g | PACK | Freq: Every day | ORAL | Status: DC

## 2018-12-20 MED ORDER — MEPERIDINE HCL 50 MG/ML IJ SOLN
INTRAMUSCULAR | Status: AC
  Filled 2018-12-20: qty 1

## 2018-12-20 MED ORDER — SODIUM CHLORIDE 0.9 % IV SOLN
INTRAVENOUS | Status: DC
  Administered 2018-12-20: 1000 mL via INTRAVENOUS

## 2018-12-20 MED ORDER — STERILE WATER FOR IRRIGATION IR SOLN
Status: DC | PRN
  Administered 2018-12-20: 1.5 mL

## 2018-12-20 MED ORDER — MIDAZOLAM HCL 5 MG/5ML IJ SOLN
INTRAMUSCULAR | Status: DC | PRN
  Administered 2018-12-20 (×4): 2 mg via INTRAVENOUS

## 2018-12-20 MED ORDER — MIDAZOLAM HCL 5 MG/5ML IJ SOLN
INTRAMUSCULAR | Status: AC
  Filled 2018-12-20: qty 10

## 2018-12-20 MED ORDER — MEPERIDINE HCL 50 MG/ML IJ SOLN
INTRAMUSCULAR | Status: DC | PRN
  Administered 2018-12-20 (×2): 25 mg via INTRAVENOUS

## 2018-12-20 NOTE — Discharge Instructions (Signed)
No aspirin or NSAIDs for 24 hours. Resume usual medications as before. High-fiber diet. Benefiber 4 g by mouth daily at bedtime. Sitz bath daily until thrombosed hemorrhoid resolves. No driving for 24 hours. Physician will call with biopsy results.      Colonoscopy, Adult, Care After This sheet gives you information about how to care for yourself after your procedure. Your doctor may also give you more specific instructions. If you have problems or questions, call your doctor. What can I expect after the procedure? After the procedure, it is common to have:  A small amount of blood in your poop for 24 hours.  Some gas.  Mild cramping or bloating in your belly. Follow these instructions at home: General instructions  For the first 24 hours after the procedure: ? Do not drive or use machinery. ? Do not sign important documents. ? Do not drink alcohol. ? Do your daily activities more slowly than normal. ? Eat foods that are soft and easy to digest.  Take over-the-counter or prescription medicines only as told by your doctor. To help cramping and bloating:   Try walking around.  Put heat on your belly (abdomen) as told by your doctor. Use a heat source that your doctor recommends, such as a moist heat pack or a heating pad. ? Put a towel between your skin and the heat source. ? Leave the heat on for 20-30 minutes. ? Remove the heat if your skin turns bright red. This is especially important if you cannot feel pain, heat, or cold. You can get burned. Eating and drinking   Drink enough fluid to keep your pee (urine) clear or pale yellow.  Return to your normal diet as told by your doctor. Avoid heavy or fried foods that are hard to digest.  Avoid drinking alcohol for as long as told by your doctor. Contact a doctor if:  You have blood in your poop (stool) 2-3 days after the procedure. Get help right away if:  You have more than a small amount of blood in your  poop.  You see large clumps of tissue (blood clots) in your poop.  Your belly is swollen.  You feel sick to your stomach (nauseous).  You throw up (vomit).  You have a fever.  You have belly pain that gets worse, and medicine does not help your pain. Summary  After the procedure, it is common to have a small amount of blood in your poop. You may also have mild cramping and bloating in your belly.  For the first 24 hours after the procedure, do not drive or use machinery, do not sign important documents, and do not drink alcohol.  Get help right away if you have a lot of blood in your poop, feel sick to your stomach, have a fever, or have more belly pain. This information is not intended to replace advice given to you by your health care provider. Make sure you discuss any questions you have with your health care provider. Document Released: 01/30/2010 Document Revised: 10/28/2016 Document Reviewed: 09/22/2015 Elsevier Patient Education  [place].     High-Fiber Diet Fiber, also called dietary fiber, is a type of carbohydrate that is found in fruits, vegetables, whole grains, and beans. A high-fiber diet can have many health benefits. Your health care provider may recommend a high-fiber diet to help:  Prevent constipation. Fiber can make your bowel movements more regular.  Lower your cholesterol.  Relieve the following conditions: ? Swelling of  veins in the anus (hemorrhoids). ? Swelling and irritation (inflammation) of specific areas of the digestive tract (uncomplicated diverticulosis). ? A problem of the large intestine (colon) that sometimes causes pain and diarrhea (irritable bowel syndrome, IBS).  Prevent overeating as part of a weight-loss plan.  Prevent heart disease, type 2 diabetes, and certain cancers. What is my plan? The recommended daily fiber intake in grams (g) includes:  38 g for men age 81 or younger.  30 g for men over age 70.  16 g for  women age 75 or younger.  21 g for women over age 62. You can get the recommended daily intake of dietary fiber by:  Eating a variety of fruits, vegetables, grains, and beans.  Taking a fiber supplement, if it is not possible to get enough fiber through your diet. What do I need to know about a high-fiber diet?  It is better to get fiber through food sources rather than from fiber supplements. There is not a lot of research about how effective supplements are.  Always check the fiber content on the nutrition facts label of any prepackaged food. Look for foods that contain 5 g of fiber or more per serving.  Talk with a diet and nutrition specialist (dietitian) if you have questions about specific foods that are recommended or not recommended for your medical condition, especially if those foods are not listed below.  Gradually increase how much fiber you consume. If you increase your intake of dietary fiber too quickly, you may have bloating, cramping, or gas.  Drink plenty of water. Water helps you to digest fiber. What are tips for following this plan?  Eat a wide variety of high-fiber foods.  Make sure that half of the grains that you eat each day are whole grains.  Eat breads and cereals that are made with whole-grain flour instead of refined flour or white flour.  Eat brown rice, bulgur wheat, or millet instead of white rice.  Start the day with a breakfast that is high in fiber, such as a cereal that contains 5 g of fiber or more per serving.  Use beans in place of meat in soups, salads, and pasta dishes.  Eat high-fiber snacks, such as berries, raw vegetables, nuts, and popcorn.  Choose whole fruits and vegetables instead of processed forms like juice or sauce. What foods can I eat?  Fruits Berries. Pears. Apples. Oranges. Avocado. Prunes and raisins. Dried figs. Vegetables Sweet potatoes. Spinach. Kale. Artichokes. Cabbage. Broccoli. Cauliflower. Green peas. Carrots.  Squash. Grains Whole-grain breads. Multigrain cereal. Oats and oatmeal. Brown rice. Barley. Bulgur wheat. [place]. Quinoa. Bran muffins. Popcorn. Rye wafer crackers. Meats and other proteins Navy, kidney, and pinto beans. Soybeans. Split peas. Lentils. Nuts and seeds. Dairy Fiber-fortified yogurt. Beverages Fiber-fortified soy milk. Fiber-fortified orange juice. Other foods Fiber bars. The items listed above may not be a complete list of recommended foods and beverages. Contact a dietitian for more options. What foods are not recommended? Fruits Fruit juice. Cooked, strained fruit. Vegetables Fried potatoes. Canned vegetables. Well-cooked vegetables. Grains White bread. Pasta made with refined flour. White rice. Meats and other proteins Fatty cuts of meat. Fried chicken or fried fish. Dairy Milk. Yogurt. Cream cheese. Sour cream. Fats and oils Butters. Beverages Soft drinks. Other foods Cakes and pastries. The items listed above may not be a complete list of foods and beverages to avoid. Contact a dietitian for more information. Summary  Fiber is a type of carbohydrate. It is found in  fruits, vegetables, whole grains, and beans.  There are many health benefits of eating a high-fiber diet, such as preventing constipation, lowering blood cholesterol, helping with weight loss, and reducing your risk of heart disease, diabetes, and certain cancers.  Gradually increase your intake of fiber. Increasing too fast can result in cramping, bloating, and gas. Drink plenty of water while you increase your fiber.  The best sources of fiber include whole fruits and vegetables, whole grains, nuts, seeds, and beans. This information is not intended to replace advice given to you by your health care provider. Make sure you discuss any questions you have with your health care provider. Document Released: 12/28/2004 Document Revised: 11/01/2016 Document Reviewed: 11/01/2016 Elsevier Patient  Education  2020 [employer].    Hemorrhoids Hemorrhoids are swollen veins in and around the rectum or anus. There are two types of hemorrhoids:  Internal hemorrhoids. These occur in the veins that are just inside the rectum. They may poke through to the outside and become irritated and painful.  External hemorrhoids. These occur in the veins that are outside the anus and can be felt as a painful swelling or hard lump near the anus. Most hemorrhoids do not cause serious problems, and they can be managed with home treatments such as diet and lifestyle changes. If home treatments do not help the symptoms, procedures can be done to shrink or remove the hemorrhoids. What are the causes? This condition is caused by increased pressure in the anal area. This pressure may result from various things, including:  Constipation.  Straining to have a bowel movement.  Diarrhea.  Pregnancy.  Obesity.  Sitting for long periods of time.  Heavy lifting or other activity that causes you to strain.  Anal sex.  Riding a bike for a long period of time. What are the signs or symptoms? Symptoms of this condition include:  Pain.  Anal itching or irritation.  Rectal bleeding.  Leakage of stool (feces).  Anal swelling.  One or more lumps around the anus. How is this diagnosed? This condition can often be diagnosed through a visual exam. Other exams or tests may also be done, such as:  An exam that involves feeling the rectal area with a gloved hand (digital rectal exam).  An exam of the anal canal that is done using a small tube (anoscope).  A blood test, if you have lost a significant amount of blood.  A test to look inside the colon using a flexible tube with a camera on the end (sigmoidoscopy or colonoscopy). How is this treated? This condition can usually be treated at home. However, various procedures may be done if dietary changes, lifestyle changes, and other home treatments do  not help your symptoms. These procedures can help make the hemorrhoids smaller or remove them completely. Some of these procedures involve surgery, and others do not. Common procedures include:  Rubber band ligation. Rubber bands are placed at the base of the hemorrhoids to cut off their blood supply.  Sclerotherapy. Medicine is injected into the hemorrhoids to shrink them.  Infrared coagulation. A type of light energy is used to get rid of the hemorrhoids.  Hemorrhoidectomy surgery. The hemorrhoids are surgically removed, and the veins that supply them are tied off.  Stapled hemorrhoidopexy surgery. The surgeon staples the base of the hemorrhoid to the rectal wall. Follow these instructions at home: Eating and drinking   Eat foods that have a lot of fiber in them, such as whole grains, beans, nuts,  fruits, and vegetables.  Ask your health care provider about taking products that have added fiber (fiber supplements).  Reduce the amount of fat in your diet. You can do this by eating low-fat dairy products, eating less red meat, and avoiding processed foods.  Drink enough fluid to keep your urine pale yellow. Managing pain and swelling   Take warm sitz baths for 20 minutes, 3-4 times a day to ease pain and discomfort. You may do this in a bathtub or using a portable sitz bath that fits over the toilet.  If directed, apply ice to the affected area. Using ice packs between sitz baths may be helpful. ? Put ice in a plastic bag. ? Place a towel between your skin and the bag. ? Leave the ice on for 20 minutes, 2-3 times a day. General instructions  Take over-the-counter and prescription medicines only as told by your health care provider.  Use medicated creams or suppositories as told.  Get regular exercise. Ask your health care provider how much and what kind of exercise is best for you. In general, you should do moderate exercise for at least 30 minutes on most days of the week (150  minutes each week). This can include activities such as walking, biking, or yoga.  Go to the bathroom when you have the urge to have a bowel movement. Do not wait.  Avoid straining to have bowel movements.  Keep the anal area dry and clean. Use wet toilet paper or moist towelettes after a bowel movement.  Do not sit on the toilet for long periods of time. This increases blood pooling and pain.  Keep all follow-up visits as told by your health care provider. This is important. Contact a health care provider if you have:  Increasing pain and swelling that are not controlled by treatment or medicine.  Difficulty having a bowel movement, or you are unable to have a bowel movement.  Pain or inflammation outside the area of the hemorrhoids. Get help right away if you have:  Uncontrolled bleeding from your rectum. Summary  Hemorrhoids are swollen veins in and around the rectum or anus.  Most hemorrhoids can be managed with home treatments such as diet and lifestyle changes.  Taking warm sitz baths can help ease pain and discomfort.  In severe cases, procedures or surgery can be done to shrink or remove the hemorrhoids. This information is not intended to replace advice given to you by your health care provider. Make sure you discuss any questions you have with your health care provider. Document Released: 12/26/1999 Document Revised: 01/05/2018 Document Reviewed: 05/19/2017 Elsevier Patient Education  2020 [employer].

## 2018-12-20 NOTE — H&P (Signed)
Julie Humphrey is an 63 y.o. female.   Chief Complaint: Patient is here for colonoscopy. HPI: Patient 63 year old Caucasian female who is here for screening colonoscopy.  Last exam was in September 2007 revealing internal and external hemorrhoids.  She does notice bleeding with her bowel movements every now and then.  Usually it small but sometimes moderate.  Bleeding is felt to be due to hemorrhoids.  Her bowels are regular.  She denies abdominal pain anorexia or weight loss. She does not take aspirin or NSAIDs. Family history is negative for CRC.  Past Medical History:  Diagnosis Date  . Rectocele 03/29/2012    Past Surgical History:  Procedure Laterality Date  . ABDOMINAL HYSTERECTOMY    . arm surgery Left    paltes and screws  . LAPAROSCOPIC APPENDECTOMY N/A 09/30/2017   Procedure: APPENDECTOMY LAPAROSCOPIC;  Surgeon: Aviva Signs, MD;  Location: [facility];  Service: General;  Laterality: N/A;  . TONSILLECTOMY    . TUBAL LIGATION Bilateral 1987    Family History  Problem Relation Age of Onset  . Hyperlipidemia Mother   . Hypertension Mother   . Heart attack Father   . Diabetes Father   . Cancer Father        kidney  . CAD Maternal Grandmother   . Heart disease Maternal Grandmother   . CAD Paternal Grandmother   . Diabetes Paternal Grandmother   . Heart disease Paternal Grandmother   . Heart disease Maternal Grandfather   . Diabetes Paternal Grandfather   . Cancer Paternal Grandfather   . Diabetes Paternal Aunt    Social History:  reports that she has never smoked. She has never used smokeless tobacco. She reports current alcohol use of about 2.0 standard drinks of alcohol per week. She reports that she does not use drugs.  Allergies: No Known Allergies  Medications Prior to Admission  Medication Sig Dispense Refill  . Ascorbic Acid (VITAMIN C) 1000 MG tablet Take 1,000 mg by mouth daily.    . Biotin 5000 MCG TABS Take 5,000 mcg by mouth daily.    . Coenzyme Q10 (COQ10)  100 MG CAPS Take 100 mg by mouth daily.    Marland Kitchen GAVILYTE-C 240 g solution Take 4,000 mLs by mouth once.    . Melatonin 10 MG TABS Take 20 mg by mouth at bedtime.    . Multiple Vitamin (MULTIVITAMIN WITH MINERALS) TABS tablet Take 1 tablet by mouth daily. Women's Centrum Silver    . Omega-3 Fatty Acids (FISH OIL) 1200 MG CAPS Take 1,200 mg by mouth daily.    Vladimir Faster Glycol-Propyl Glycol (SYSTANE) 0.4-0.3 % SOLN Place 1 drop into both eyes 3 (three) times daily as needed (dry/irritated eyes).    . tretinoin (RETIN-A) 0.025 % cream Apply topically at bedtime. (Patient taking differently: Apply 1 application topically daily. ) 45 g 2  . TURMERIC PO Take 2,000 mg by mouth daily. 1000 mg/capsule    . Zinc 50 MG TABS Take 50 mg by mouth daily.      No results found for this or any previous visit (from the past 48 hour(s)). No results found.  ROS  Blood pressure 126/72, pulse (!) 58, temperature 98.6 F (37 C), temperature source Axillary, resp. rate 19, last menstrual period 10/30/2006, SpO2 100 %. Physical Exam  Constitutional: She appears well-developed and well-nourished.  HENT:  Mouth/Throat: Oropharynx is clear and moist.  Eyes: Conjunctivae are normal. No scleral icterus.  Neck: No thyromegaly present.  Cardiovascular: Normal rate, regular rhythm  and normal heart sounds.  No murmur heard. GI: Soft. She exhibits no distension and no mass. There is no abdominal tenderness.  Musculoskeletal:        General: No edema.  Lymphadenopathy:    She has no cervical adenopathy.  Neurological: She is alert.  Skin: Skin is warm and dry.     Assessment/Plan Average risk screening colonoscopy.  Hildred Laser, MD 12/20/2018, 9:00 AM

## 2018-12-20 NOTE — Op Note (Signed)
[facility] Patient Name: Julie Humphrey Procedure Date: 12/20/2018 8:26 AM MRN: [id number] Date of Birth: June 29, 1955 Attending MD: Hildred Laser , MD CSN: [id number] Age: 63 Admit Type: Outpatient Procedure:                Colonoscopy Indications:              Screening for colorectal malignant neoplasm Providers:                Hildred Laser, MD, Otis Peak B. Sharon Seller, RN, Raphael Gibney, Technician Referring MD:             Jasper Loser. Luan Pulling, MD Medicines:                Meperidine 50 mg IV, Midazolam 8 mg IV Complications:            No immediate complications. Estimated Blood Loss:     Estimated blood loss was minimal. Procedure:                Pre-Anesthesia Assessment:                           - Prior to the procedure, a History and Physical                            was performed, and patient medications and                            allergies were reviewed. The patient's tolerance of                            previous anesthesia was also reviewed. The risks                            and benefits of the procedure and the sedation                            options and risks were discussed with the patient.                            All questions were answered, and informed consent                            was obtained. Prior Anticoagulants: The patient has                            taken no previous anticoagulant or antiplatelet                            agents. ASA Grade Assessment: I - A normal, healthy                            patient. After reviewing the risks and benefits,  the patient was deemed in satisfactory condition to                            undergo the procedure.                           - Prior to the procedure, a History and Physical                            was performed, and patient medications and                            allergies were reviewed. The patient's tolerance of                             previous anesthesia was also reviewed. The risks                            and benefits of the procedure and the sedation                            options and risks were discussed with the patient.                            All questions were answered, and informed consent                            was obtained. Prior Anticoagulants: The patient has                            taken no previous anticoagulant or antiplatelet                            agents. After reviewing the risks and benefits, the                            patient was deemed in satisfactory condition to                            undergo the procedure.                           After obtaining informed consent, the colonoscope                            was passed under direct vision. Throughout the                            procedure, the patient's blood pressure, pulse, and                            oxygen saturations were monitored continuously. The  [id number] scope was introduced through                            the anus and advanced to the the cecum, identified                            by appendiceal orifice and ileocecal valve. The                            colonoscopy was performed without difficulty. The                            patient tolerated the procedure well. The quality                            of the bowel preparation was good. The ileocecal                            valve, appendiceal orifice, and rectum were                            photographed. Scope In: 9:08:52 AM Scope Out: 9:27:15 AM Scope Withdrawal Time: 0 hours 10 minutes 17 seconds  Total Procedure Duration: 0 hours 18 minutes 23 seconds  Findings:      Hemorrhoids were found on perianal exam.      The digital rectal exam was normal.      A diminutive polyp was found in the hepatic flexure. The polyp was       sessile. Biopsies were taken with a cold forceps for  histology. The       pathology specimen was placed into Bottle Number 1. The pathology       specimen was placed into Bottle Number 1.      A 5 mm polyp was found in the hepatic flexure. The polyp was removed       with a cold snare. Resection and retrieval were complete. The pathology       specimen was placed into Bottle Number 1.      The exam was otherwise normal throughout the examined colon.      Internal hemorrhoids were found during retroflexion. The hemorrhoids       were small. Impression:               - Hemorrhoids found on perianal exam.                           - One diminutive polyp at the hepatic flexure.                            Biopsied.                           - One 5 mm polyp at the hepatic flexure, removed                            with a cold snare. Resected and retrieved.                           -  Internal hemorrhoids. Moderate Sedation:      Moderate (conscious) sedation was administered by the endoscopy nurse       and supervised by the endoscopist. The following parameters were       monitored: oxygen saturation, heart rate, blood pressure, CO2       capnography and response to care. Total physician intraservice time was       23 minutes. Recommendation:           - Patient has a contact number available for                            emergencies. The signs and symptoms of potential                            delayed complications were discussed with the                            patient. Return to normal activities tomorrow.                            Written discharge instructions were provided to the                            patient.                           - High fiber diet today.                           - Continue present medications.                           - No aspirin, ibuprofen, naproxen, or other                            non-steroidal anti-inflammatory drugs for 1 day.                           - Await pathology results.                            - Repeat colonoscopy is recommended. The                            colonoscopy date will be determined after pathology                            results from today's exam become available for                            review. Procedure Code(s):        --- Professional ---                           [phone], Colonoscopy, flexible; with removal of  tumor(s), polyp(s), or other lesion(s) by snare                            technique                           45380, 59, Colonoscopy, flexible; with biopsy,                            single or multiple                           99153, Moderate sedation; each additional 15                            minutes intraservice time                           G0500, Moderate sedation services provided by the                            same physician or other qualified health care                            professional performing a gastrointestinal                            endoscopic service that sedation supports,                            requiring the presence of an independent trained                            observer to assist in the monitoring of the                            patient's level of consciousness and physiological                            status; initial 15 minutes of intra-service time;                            patient age 85 years or older (additional time may                            be reported with [phone], as appropriate) Diagnosis Code(s):        --- Professional ---                           Z12.11, Encounter for screening for malignant                            neoplasm of colon                           K64.8, Other hemorrhoids  K63.5, Polyp of colon CPT copyright 2019 American [employer] Association. All rights reserved. The codes documented in this report are preliminary and upon coder review may  be revised to meet current compliance  requirements. Hildred Laser, MD Hildred Laser, MD 12/20/2018 9:41:26 AM This report has been signed electronically. Number of Addenda: 0

## 2018-12-21 LAB — SURGICAL PATHOLOGY

## 2019-01-22 ENCOUNTER — Other Ambulatory Visit (HOSPITAL_COMMUNITY): Payer: Self-pay | Admitting: Obstetrics and Gynecology

## 2019-01-22 ENCOUNTER — Other Ambulatory Visit (HOSPITAL_COMMUNITY): Payer: Self-pay | Admitting: Family Medicine

## 2019-01-22 DIAGNOSIS — Z1231 Encounter for screening mammogram for malignant neoplasm of breast: Secondary | ICD-10-CM

## 2019-03-05 ENCOUNTER — Encounter: Payer: Self-pay | Admitting: Family Medicine

## 2019-03-05 ENCOUNTER — Other Ambulatory Visit: Payer: Self-pay

## 2019-03-05 ENCOUNTER — Ambulatory Visit (INDEPENDENT_AMBULATORY_CARE_PROVIDER_SITE_OTHER): Payer: Medicare Other | Admitting: Family Medicine

## 2019-03-05 VITALS — BP 124/78 | HR 58 | Temp 97.9°F | Ht 66.0 in | Wt 227.8 lb

## 2019-03-05 DIAGNOSIS — R635 Abnormal weight gain: Secondary | ICD-10-CM

## 2019-03-05 DIAGNOSIS — E785 Hyperlipidemia, unspecified: Secondary | ICD-10-CM | POA: Insufficient documentation

## 2019-03-05 NOTE — Progress Notes (Signed)
New Patient Office Visit  Subjective:  Patient ID: Julie Humphrey, female    DOB: [birth year]  Age: 64 y.o. MRN: [id number] Total Private Pay: 7   Fill Date ID   Written Drug Qty Days Prescriber Rx # Pharmacy Refill   Daily Dose* Pymt Type PMP    07/11/2018  1   07/11/2018  Phentermine 37.5 MG Tablet  42.00  28 Di Mor   79   The [phone])   0   Private Pay   [place]  06/01/2018  1   06/01/2018  Phentermine 37.5 MG Tablet  42.00  28 Je Sch   87   The (0520)   0   Private Pay   [place]  09/15/2017  1   09/15/2017  Phentermine 37.5 MG Tablet  42.00  28 Je Sch   69   The (0520)   0   Private Pay   [place]  08/15/2017  1   08/15/2017  Tramadol Hcl 50 MG Tablet  40.00  10 Er Les   [id number]   [place] (2661)   0  20.00 MME  Private Pay   [place]  07/31/2017  1   07/30/2017  Oxycodone-Acetaminophen 5-325  13.00  5 [place]   [id number]   [place] (2661)   0  19.50 MME  Private Pay   [place]  04/01/2017  1   04/01/2017  Phentermine 37.5 MG Tablet  42.00  28 Di Mor   37   The [phone])   0   Private Pay   [place]  03/04/2017  1   03/04/2017  Phentermine 37.5 MG Tablet  42.00  28 Di Mor   80   The (0520)   0        CC:  Chief Complaint  Patient presents with  . Establish Care  sleep -insomnia HPI Julie Humphrey presents for concerns about weight loss-phentermine  Past Medical History:  Diagnosis Date  . Rectocele 03/29/2012    Past Surgical History:  Procedure Laterality Date  . APPENDECTOMY    . arm surgery Left    paltes and screws  . COLONOSCOPY N/A 12/20/2018   Procedure: COLONOSCOPY;  Surgeon: Rogene Houston, MD;  Location: [facility];  Service: Endoscopy;  Laterality: N/A;  12/20/2018  . LAPAROSCOPIC APPENDECTOMY N/A 09/30/2017   Procedure: APPENDECTOMY LAPAROSCOPIC;  Surgeon: Aviva Signs, MD;  Location: [facility];  Service: General;  Laterality: N/A;  . POLYPECTOMY  12/20/2018   Procedure: POLYPECTOMY;  Surgeon: Rogene Houston, MD;  Location: [facility];  Service: Endoscopy;;  . TONSILLECTOMY    . TUBAL LIGATION Bilateral  1987    Family History  Problem Relation Age of Onset  . Hyperlipidemia Mother   . Hypertension Mother   . Heart attack Father   . Diabetes Father   . Cancer Father        kidney  . CAD Maternal Grandmother   . Heart disease Maternal Grandmother   . CAD Paternal Grandmother   . Diabetes Paternal Grandmother   . Heart disease Paternal Grandmother   . Heart disease Maternal Grandfather   . Diabetes Paternal Grandfather   . Cancer Paternal Grandfather   . Diabetes Paternal Aunt     Social History   Socioeconomic History  . Marital status: Married    Spouse name: Not on file  . Number of children: Not on file  . Years of education: Not on file  . Highest education level: Not on file  Occupational  History  . Not on file  Tobacco Use  . Smoking status: Never Smoker  . Smokeless tobacco: Never Used  Substance and Sexual Activity  . Alcohol use: Yes    Alcohol/week: 2.0 standard drinks    Types: 2 Glasses of wine per week  . Drug use: No  . Sexual activity: Yes    Partners: Male    Birth control/protection: Post-menopausal  Other Topics Concern  . Not on file  Social History Narrative  . Not on file   Social Determinants of Health   Financial Resource Strain:   . Difficulty of Paying Living Expenses: Not on file  Food Insecurity:   . Worried About Charity fundraiser in the Last Year: Not on file  . Ran Out of Food in the Last Year: Not on file  Transportation Needs:   . Lack of Transportation (Medical): Not on file  . Lack of Transportation (Non-Medical): Not on file  Physical Activity:   . Days of Exercise per Week: Not on file  . Minutes of Exercise per Session: Not on file  Stress:   . Feeling of Stress : Not on file  Social Connections:   . Frequency of Communication with Friends and Family: Not on file  . Frequency of Social Gatherings with Friends and Family: Not on file  . Attends Religious Services: Not on file  . Active Member of Clubs or  Organizations: Not on file  . Attends Archivist Meetings: Not on file  . Marital Status: Not on file  Intimate Partner Violence:   . Fear of Current or Ex-Partner: Not on file  . Emotionally Abused: Not on file  . Physically Abused: Not on file  . Sexually Abused: Not on file    ROS Review of Systems  Constitutional: Negative.   HENT:       Left ear-feels closed  Eyes:       Reading glasses/contacts  Respiratory: Negative.   Cardiovascular: Negative.   Gastrointestinal:       12/9-colonoscopy-internal hemorrhoid   Endocrine: Negative.   Genitourinary: Negative.   Musculoskeletal: Negative.   Skin:       3/20-skin exam-yearly-no skin CA  Allergic/Immunologic: Negative.   Neurological: Negative.   Hematological: Negative.   Psychiatric/Behavioral: Positive for sleep disturbance.    Objective:   Today's Vitals: BP 124/78 (BP Location: Left Arm, Patient Position: Sitting)   Pulse (!) 58   Temp 97.9 F (36.6 C) (Temporal)   Ht 5\' 6"  (1.676 m)   Wt 227 lb 12.8 oz (103.3 kg)   LMP 10/30/2006   SpO2 97%   BMI 36.77 kg/m   Physical Exam Vitals reviewed.  Constitutional:      Appearance: Normal appearance.  HENT:     Head: Normocephalic and atraumatic.     Left Ear: External ear normal.     Ears:     Comments: cerumen Cardiovascular:     Rate and Rhythm: Normal rate and regular rhythm.     Pulses: Normal pulses.     Heart sounds: Normal heart sounds.  Pulmonary:     Effort: Pulmonary effort is normal.     Breath sounds: Normal breath sounds.  Musculoskeletal:     Cervical back: Normal range of motion and neck supple.  Neurological:     Mental Status: She is alert and oriented to person, place, and time.  Psychiatric:        Mood and Affect: Mood normal.  Behavior: Behavior normal.     Assessment & Plan:  1. Weight gain - Amb ref to Medical Nutrition Therapy-MNT  2. Hyperlipidemia, unspecified hyperlipidemia type No medications-omega  3 - Lipid panel - COMPLETE METABOLIC PANEL WITH GFR - Amb ref to Medical Nutrition Therapy-MNT  Outpatient Encounter Medications as of 03/05/2019  Medication Sig  . Ascorbic Acid (VITAMIN C) 1000 MG tablet Take 1,000 mg by mouth daily.  Marland Kitchen b complex vitamins tablet Take 1 tablet by mouth daily.  . Biotin 5000 MCG TABS Take 5,000 mcg by mouth daily.  . Coenzyme Q10 (COQ10) 100 MG CAPS Take 100 mg by mouth daily.  . Melatonin 10 MG TABS Take 20 mg by mouth at bedtime.  . Omega-3 Fatty Acids (FISH OIL) 1200 MG CAPS Take 1,200 mg by mouth daily.  Vladimir Faster Glycol-Propyl Glycol (SYSTANE) 0.4-0.3 % SOLN Place 1 drop into both eyes 3 (three) times daily as needed (dry/irritated eyes).  . tretinoin (RETIN-A) 0.025 % cream Apply topically at bedtime. (Patient taking differently: Apply 1 application topically daily. )  . TURMERIC PO Take 2,000 mg by mouth daily. 1000 mg/capsule  . Zinc 50 MG TABS Take 50 mg by mouth daily.  . [DISCONTINUED] Multiple Vitamin (MULTIVITAMIN WITH MINERALS) TABS tablet Take 1 tablet by mouth daily. Women's Centrum Silver  . [DISCONTINUED] Wheat Dextrin (BENEFIBER DRINK MIX) PACK Take 4 g by mouth at bedtime.   No facility-administered encounter medications on file as of 03/05/2019.    Follow-up:  Dietitian , suggested sleep specialist  Ehren Berisha Hannah Beat, MD

## 2019-03-05 NOTE — Patient Instructions (Addendum)
Weight loss-suggested weight loss clinic  Pap smear-schedule at your convenience  Dietitian

## 2019-03-12 ENCOUNTER — Ambulatory Visit (HOSPITAL_COMMUNITY)
Admission: RE | Admit: 2019-03-12 | Discharge: 2019-03-12 | Disposition: A | Discharge status: Home / Self Care | Payer: Medicare Other | Source: Ambulatory Visit | Attending: Family Medicine | Admitting: Family Medicine

## 2019-03-12 ENCOUNTER — Other Ambulatory Visit: Payer: Self-pay

## 2019-03-12 DIAGNOSIS — Z1231 Encounter for screening mammogram for malignant neoplasm of breast: Secondary | ICD-10-CM | POA: Diagnosis not present

## 2019-03-13 ENCOUNTER — Encounter: Payer: Self-pay | Admitting: Family Medicine

## 2019-03-13 ENCOUNTER — Other Ambulatory Visit (HOSPITAL_COMMUNITY): Payer: Self-pay | Admitting: Family Medicine

## 2019-03-13 ENCOUNTER — Other Ambulatory Visit (HOSPITAL_COMMUNITY)
Admission: RE | Admit: 2019-03-13 | Discharge: 2019-03-13 | Disposition: A | Discharge status: Home / Self Care | Payer: Medicare Other | Source: Ambulatory Visit | Attending: Family Medicine | Admitting: Family Medicine

## 2019-03-13 ENCOUNTER — Ambulatory Visit (INDEPENDENT_AMBULATORY_CARE_PROVIDER_SITE_OTHER): Payer: Medicare Other | Admitting: Family Medicine

## 2019-03-13 ENCOUNTER — Other Ambulatory Visit: Payer: Self-pay

## 2019-03-13 VITALS — BP 164/84 | HR 94 | Temp 97.4°F | Wt 224.2 lb

## 2019-03-13 DIAGNOSIS — E785 Hyperlipidemia, unspecified: Secondary | ICD-10-CM | POA: Diagnosis not present

## 2019-03-13 DIAGNOSIS — Z1151 Encounter for screening for human papillomavirus (HPV): Secondary | ICD-10-CM | POA: Diagnosis not present

## 2019-03-13 DIAGNOSIS — Z0000 Encounter for general adult medical examination without abnormal findings: Secondary | ICD-10-CM | POA: Diagnosis not present

## 2019-03-13 DIAGNOSIS — R928 Other abnormal and inconclusive findings on diagnostic imaging of breast: Secondary | ICD-10-CM

## 2019-03-13 DIAGNOSIS — Z780 Asymptomatic menopausal state: Secondary | ICD-10-CM | POA: Diagnosis not present

## 2019-03-13 DIAGNOSIS — Z01419 Encounter for gynecological examination (general) (routine) without abnormal findings: Secondary | ICD-10-CM | POA: Diagnosis present

## 2019-03-13 NOTE — Patient Instructions (Addendum)
Kegel Exercises  Kegel exercises can help strengthen your pelvic floor muscles. The pelvic floor is a group of muscles that support your rectum, small intestine, and bladder. In females, pelvic floor muscles also help support the womb (uterus). These muscles help you control the flow of urine and stool. Kegel exercises are painless and simple, and they do not require any equipment. Your provider may suggest Kegel exercises to:  Improve bladder and bowel control.  Improve sexual response.  Improve weak pelvic floor muscles after surgery to remove the uterus (hysterectomy) or pregnancy (females).  Improve weak pelvic floor muscles after prostate gland removal or surgery (males). Kegel exercises involve squeezing your pelvic floor muscles, which are the same muscles you squeeze when you try to stop the flow of urine or keep from passing gas. The exercises can be done while sitting, standing, or lying down, but it is best to vary your position. Exercises How to do Kegel exercises: 1. Squeeze your pelvic floor muscles tight. You should feel a tight lift in your rectal area. If you are a female, you should also feel a tightness in your vaginal area. Keep your stomach, buttocks, and legs relaxed. 2. Hold the muscles tight for up to 10 seconds. 3. Breathe normally. 4. Relax your muscles. 5. Repeat as told by your health care provider. Repeat this exercise daily as told by your health care provider. Continue to do this exercise for at least 4-6 weeks, or for as long as told by your health care provider. You may be referred to a physical therapist who can help you learn more about how to do Kegel exercises. Depending on your condition, your health care provider may recommend:  Varying how long you squeeze your muscles.  Doing several sets of exercises every day.  Doing exercises for several weeks.  Making Kegel exercises a part of your regular exercise routine. This information is not intended  to replace advice given to you by your health care provider. Make sure you discuss any questions you have with your health care provider. Document Revised: 08/17/2017 Document Reviewed: 08/17/2017 Elsevier Patient Education  [employer].  Calcium; Vitamin D oral tablets What is this medicine? CALCIUM; VITAMIN D (KAL see um; VYE ta min D) is a vitamin supplement. It is used to prevent conditions of low calcium and vitamin D. This medicine may be used for other purposes; ask your health care provider or pharmacist if you have questions. COMMON BRAND NAME(S): Calcarb 600 with Vitamin D, Calcet Plus Vitamin D, Calcitrate + D, Calcium Citrate + D3 Maximum, Calcium Citrate Maximum with D, Caltrate, Caltrate 600+D, Citracal + D, Citracal MAXIMUM + D, Citracal Petites with Vitamin D, Citrus Calcium Plus D, OSCAL 500 + D, OSCAL Calcium + D3, OSCAL Extra D3, Osteo-Poretical, Oysco 500 + D, Oysco D, Oystercal-D Calcium What should I tell my health care provider before I take this medicine? They need to know if you have any of these conditions: constipation dehydration heart disease high level of calcium or vitamin D in the blood high level of phosphate in the blood kidney disease kidney stones liver disease parathyroid disease sarcoidosis stomach ulcer or obstruction an unusual or allergic reaction to calcium, vitamin D, tartrazine dye, other medicines, foods, dyes, or preservatives pregnant or trying to get pregnant breast-feeding How should I use this medicine? Take this medicine by mouth with a glass of water. Follow the directions on the label. Take with food or within 1 hour after a meal. Take  your medicine at regular intervals. Do not take your medicine more often than directed. Talk to your pediatrician regarding the use of this medicine in children. While this medicine may be used in children for selected conditions, precautions do apply. Overdosage: If you think you have taken too  much of this medicine contact a poison control center or emergency room at once. NOTE: This medicine is only for you. Do not share this medicine with others. What if I miss a dose? If you miss a dose, take it as soon as you can. If it is almost time for your next dose, take only that dose. Do not take double or extra doses. What may interact with this medicine? Do not take this medicine with any of the following medications: ammonium chloride methenamine This medicine may also interact with the following medications: antibiotics like ciprofloxacin, gatifloxacin, tetracycline captopril delavirdine diuretics gabapentin iron supplements medicines for fungal infections like ketoconazole and itraconazole medicines for seizures like ethotoin and phenytoin mineral oil mycophenolate other vitamins with calcium, vitamin D, or minerals quinidine rosuvastatin sucralfate thyroid medicine This list may not describe all possible interactions. Give your health care provider a list of all the medicines, herbs, non-prescription drugs, or dietary supplements you use. Also tell them if you smoke, drink alcohol, or use illegal drugs. Some items may interact with your medicine. What should I watch for while using this medicine? Taking this medicine is not a substitute for a well-balanced diet and exercise. Talk with your doctor or health care provider and follow a healthy lifestyle. Do not take this medicine with high-fiber foods, large amounts of alcohol, or drinks containing caffeine. Do not take this medicine within 2 hours of any other medicines. What side effects may I notice from receiving this medicine? Side effects that you should report to your doctor or health care professional as soon as possible: allergic reactions like skin rash, itching or hives, swelling of the face, lips, or tongue confusion dry mouth high blood pressure increased hunger or thirst increased urination irregular  heartbeat metallic taste muscle or bone pain pain when urinating seizure unusually weak or tired weight loss Side effects that usually do not require medical attention (report to your doctor or health care professional if they continue or are bothersome): constipation diarrhea headache loss of appetite nausea, vomiting stomach upset This list may not describe all possible side effects. Call your doctor for medical advice about side effects. You may report side effects to FDA at 1-800-FDA-1088. Where should I keep my medicine? Keep out of the reach of children. Store at room temperature between 15 and 30 degrees C (59 and 86 degrees F). Protect from light. Keep container tightly closed. Throw away any unused medicine after the expiration date. NOTE: This sheet is a summary. It may not cover all possible information. If you have questions about this medicine, talk to your doctor, pharmacist, or health care provider.  2020 Elsevier/Gold Standard (2007-04-12 17:56:23)

## 2019-03-13 NOTE — Progress Notes (Signed)
3/2/20218:51 AM  Julie Humphrey 07/01/1955, 64 y.o., female [id number]  Chief Complaint  Patient presents with  . Gynecologic Exam    HPI: WELL WOMAN EXAM  LMP: CHANGES IN PERIODS:menopause -10 years ago NUMBER OF PREGNANCIES:5 CONTRACEPTION/HRT tubal LAST MAMMOGRAM/Ultrasound:03/12/19 GYN SURGERIES:tubal   Fall Risk  03/05/2019  Falls in the past year? 0  Number falls in past yr: 0  Injury with Fall? 0   Depression screen PHQ 2/9 03/05/2019  Decreased Interest 0  Down, Depressed, Hopeless 3  PHQ - 2 Score 3  Altered sleeping 3  Tired, decreased energy 0  Change in appetite 3  Feeling bad or failure about yourself  3  Trouble concentrating 0  Moving slowly or fidgety/restless 0  Suicidal thoughts 0  PHQ-9 Score 12  Difficult doing work/chores Not difficult at all    No Known Allergies  Prior to Admission medications   Medication Sig Start Date End Date Taking? Authorizing Provider  Ascorbic Acid (VITAMIN C) 1000 MG tablet Take 1,000 mg by mouth daily.   Yes [provider]  b complex vitamins tablet Take 1 tablet by mouth daily.   Yes [provider]  Biotin 5000 MCG TABS Take 5,000 mcg by mouth daily.   Yes [provider]  Coenzyme Q10 (COQ10) 100 MG CAPS Take 100 mg by mouth daily.   Yes [provider]  Melatonin 10 MG TABS Take 10 mg by mouth at bedtime.    Yes [provider]  Omega-3 Fatty Acids (FISH OIL) 1200 MG CAPS Take 1,200 mg by mouth daily.   Yes [provider]  Polyethyl Glycol-Propyl Glycol (SYSTANE) 0.4-0.3 % SOLN Place 1 drop into both eyes 3 (three) times daily as needed (dry/irritated eyes).   Yes [provider]  tretinoin (RETIN-A) 0.025 % cream Apply topically at bedtime. Patient taking differently: Apply 1 application topically daily.  03/29/12  Yes Jonnie Kind, MD  TURMERIC PO Take 2,000 mg by mouth daily. 1000 mg/capsule   Yes [provider]  Zinc 50 MG TABS  Take 50 mg by mouth daily.   Yes [provider]    Past Medical History:  Diagnosis Date  . Rectocele 03/29/2012    Past Surgical History:  Procedure Laterality Date  . APPENDECTOMY    . arm surgery Left    paltes and screws  . COLONOSCOPY N/A 12/20/2018   Procedure: COLONOSCOPY;  Surgeon: Rogene Houston, MD;  Location: [facility];  Service: Endoscopy;  Laterality: N/A;  12/20/2018  . LAPAROSCOPIC APPENDECTOMY N/A 09/30/2017   Procedure: APPENDECTOMY LAPAROSCOPIC;  Surgeon: Aviva Signs, MD;  Location: [facility];  Service: General;  Laterality: N/A;  . POLYPECTOMY  12/20/2018   Procedure: POLYPECTOMY;  Surgeon: Rogene Houston, MD;  Location: [facility];  Service: Endoscopy;;  . TONSILLECTOMY    . TUBAL LIGATION Bilateral 1987    Social History   Tobacco Use  . Smoking status: Never Smoker  . Smokeless tobacco: Never Used  Substance Use Topics  . Alcohol use: Yes    Alcohol/week: 2.0 standard drinks    Types: 2 Glasses of wine per week    Social History   Social History Narrative  . Not on file    Family History  Problem Relation Age of Onset  . Hyperlipidemia Mother   . Hypertension Mother   . Heart attack Father   . Diabetes Father   . Cancer Father        kidney  .  CAD Maternal Grandmother   . Heart disease Maternal Grandmother   . CAD Paternal Grandmother   . Diabetes Paternal Grandmother   . Heart disease Paternal Grandmother   . Heart disease Maternal Grandfather   . Diabetes Paternal Grandfather   . Cancer Paternal Grandfather   . Diabetes Paternal Aunt     Review of Systems  Constitutional: Negative.   HENT: Negative.   Eyes: Negative.   Cardiovascular: Negative.   Gastrointestinal: Negative.   Genitourinary: Negative.   Musculoskeletal: Negative.   Skin: Negative.   Neurological: Negative.   Endo/Heme/Allergies: Negative.   Psychiatric/Behavioral: Negative.     Today's Vitals   03/13/19 0822  BP: (!) 164/84  Pulse: 94   Temp: (!) 97.4 F (36.3 C)  TempSrc: Temporal  SpO2: 96%  Weight: 224 lb 3.2 oz (101.7 kg)   Body mass index is 36.19 kg/m.   EXAM:  Constitutional:  HEENT: normocephalic, atraumatic Thyroid: no enlargement Breast: symmetrical, no nipple discharge, no masses, no skin changes Lungs: clear breath sounds to auscultation Heart: no lifts or heaves , normal S1/2 with no murmur on auscultation Abdomen: no scars or striae, normal bowel sounds, no tenderness to palpation, liver and spleen non palpable Back: no CVA tenderness Lymph nodes-anterior and posterior cervical not palpable, axillary not palpable bilateral Neuro: awake and alert, oriented to person, place and time, normal gait and balance GYN: normal labia, clitoris, urethral orifice and introitus,  Cervix-pink with no lesions or discharge noted, pap collected Vagina-no lesions, normal mucosaBimanual exam: uterus is anterior, midline, not enlarged, adnexa non palpable Anus no fissure or hemorrhoids  ASSESSMENT/PLAN: 1. Wellness examination Cmp/lipid panel recommended fasting kegel exercises for pelvic floor - Cytology - PAP Benny Lennert, MD

## 2019-03-16 LAB — CYTOLOGY - PAP
Comment: NEGATIVE
Diagnosis: NEGATIVE
High risk HPV: NEGATIVE

## 2019-03-20 ENCOUNTER — Other Ambulatory Visit: Payer: Self-pay

## 2019-03-20 ENCOUNTER — Ambulatory Visit (HOSPITAL_COMMUNITY)
Admission: RE | Admit: 2019-03-20 | Discharge: 2019-03-20 | Disposition: A | Discharge status: Home / Self Care | Payer: Medicare Other | Source: Ambulatory Visit | Attending: Family Medicine | Admitting: Family Medicine

## 2019-03-20 DIAGNOSIS — R928 Other abnormal and inconclusive findings on diagnostic imaging of breast: Secondary | ICD-10-CM | POA: Diagnosis not present

## 2019-03-20 DIAGNOSIS — N6489 Other specified disorders of breast: Secondary | ICD-10-CM | POA: Diagnosis not present

## 2019-03-20 NOTE — Progress Notes (Signed)
Pt notified of the results.  

## 2019-04-06 DIAGNOSIS — E785 Hyperlipidemia, unspecified: Secondary | ICD-10-CM | POA: Diagnosis not present

## 2019-04-07 LAB — COMPLETE METABOLIC PANEL WITH GFR
AG Ratio: 1.7 (calc) (ref 1.0–2.5)
ALT: 19 U/L (ref 6–29)
AST: 19 U/L (ref 10–35)
Albumin: 4.1 g/dL (ref 3.6–5.1)
Alkaline phosphatase (APISO): 52 U/L (ref 37–153)
BUN/Creatinine Ratio: 20 (calc) (ref 6–22)
BUN: 22 mg/dL (ref 7–25)
CO2: 27 mmol/L (ref 20–32)
Calcium: 9.5 mg/dL (ref 8.6–10.4)
Chloride: 106 mmol/L (ref 98–110)
Creat: 1.1 mg/dL — ABNORMAL HIGH (ref 0.50–0.99)
GFR, Est African American: 61 mL/min/{1.73_m2} (ref >=60)
GFR, Est Non African American: 52 mL/min/{1.73_m2} — ABNORMAL LOW (ref >=60)
Globulin: 2.4 g/dL (calc) (ref 1.9–3.7)
Glucose, Bld: 86 mg/dL (ref 65–99)
Potassium: 4.9 mmol/L (ref 3.5–5.3)
Sodium: 141 mmol/L (ref 135–146)
Total Bilirubin: 1.1 mg/dL (ref 0.2–1.2)
Total Protein: 6.5 g/dL (ref 6.1–8.1)

## 2019-04-07 LAB — LIPID PANEL
Cholesterol: 224 mg/dL — ABNORMAL HIGH (ref <200)
HDL: 80 mg/dL (ref >=50)
LDL Cholesterol (Calc): 127 mg/dL (calc) — ABNORMAL HIGH
Non-HDL Cholesterol (Calc): 144 mg/dL (calc) — ABNORMAL HIGH (ref <130)
Total CHOL/HDL Ratio: 2.8 (calc) (ref <5.0)
Triglycerides: 76 mg/dL (ref <150)

## 2019-04-10 ENCOUNTER — Encounter: Payer: Self-pay | Admitting: Emergency Medicine

## 2019-04-10 NOTE — Progress Notes (Signed)
Lab letter has been mailed

## 2019-04-12 ENCOUNTER — Other Ambulatory Visit: Payer: Self-pay

## 2019-04-12 ENCOUNTER — Encounter: Payer: Medicare Other | Attending: Family Medicine | Admitting: Nutrition

## 2019-04-12 ENCOUNTER — Encounter: Payer: Self-pay | Admitting: Nutrition

## 2019-04-12 VITALS — Ht 66.0 in | Wt 229.0 lb

## 2019-04-12 DIAGNOSIS — R635 Abnormal weight gain: Secondary | ICD-10-CM

## 2019-04-12 DIAGNOSIS — N182 Chronic kidney disease, stage 2 (mild): Secondary | ICD-10-CM

## 2019-04-12 DIAGNOSIS — E782 Mixed hyperlipidemia: Secondary | ICD-10-CM

## 2019-04-12 DIAGNOSIS — E66813 Obesity, class 3: Secondary | ICD-10-CM

## 2019-04-12 NOTE — Patient Instructions (Signed)
Goals  Exercise 4 days per week Cut out seasoning seasons and use salt free Increase water into to 80 oz a day. Eat meals on time No snacks between meals Keep food journal daily.

## 2019-04-12 NOTE — Progress Notes (Signed)
  Medical Nutrition Therapy:  Appt start time: 1000 end time:  1100.   Assessment:  Primary concerns today: Overweight, Hyperlipidemia and mild kidney issues. Has gained 30 lbs in the last year or two. Wants to lose weight and is concerned about her kidney function. Wants information for the Mediterranean or DASH diet. Eats 3 meals per day. Cooks at home mostly  Admits to eating more being at home, not exercising and have some desserts from time to time. Motivated to make lifestyle changes.  PCP Dr. Holly Bodily. CMP Latest Ref Rng & Units 04/06/2019 03/28/2018 10/01/2017  Glucose 65 - 99 mg/dL 86 - 166(H)  BUN 7 - 25 mg/dL 22 17 15   Creatinine 0.50 - 0.99 mg/dL 1.10(H) 1.0 1.09(H)  Sodium 135 - 146 mmol/L 141 141 137  Potassium 3.5 - 5.3 mmol/L 4.9 4.7 4.6  Chloride 98 - 110 mmol/L 106 106 105  CO2 20 - 32 mmol/L 27 27(A) 24  Calcium 8.6 - 10.4 mg/dL 9.5 9.5 8.6(L)  Total Protein 6.1 - 8.1 g/dL 6.5 - -  Total Bilirubin 0.2 - 1.2 mg/dL 1.1 - -  Alkaline Phos 25 - 125 - 50 -  AST 10 - 35 U/L 19 17 -  ALT 6 - 29 U/L 19 15 -   Lipid Panel     Component Value Date/Time   CHOL 224 (H) 04/06/2019 0749   TRIG 76 04/06/2019 0749   HDL 80 04/06/2019 0749   CHOLHDL 2.8 04/06/2019 0749   VLDL 11 07/25/2013 1022   LDLCALC 127 (H) 04/06/2019 0749    Preferred Learning Style:   Auditory  Visual  Hands on  No preference indicated   Learning Readiness:  Ready  Change in progress   MEDICATIONS:    DIETARY INTAKE:   Eats 3 meals per day.  Usual physical activity: ADL, walks some.  Estimated energy needs: 1200-1400 calories 158 g carbohydrates 105 g protein 39 g fat  Progress Towards Goal(s):  In progress.   Nutritional Diagnosis:  NB-1.1 Food and nutrition-related knowledge deficit As related to Overweight, hyperlipidemia and renal insuffiencey..  As evidenced by LDL> 130.    Intervention:  Nutrition and weight loss education provided on My Plate, CHO counting, meal  planning, portion sizes, timing of meals, avoiding snacks between meals unless having a low blood sugar, target ranges for A1C and blood sugars, s taking medications as prescribed, benefits of exercising 60 minutes 3-4 times per week and prevention of DM. Low salt diet, DASH Diet. http://thomas.info/ .   Goals  Exercise 4 days per week Cut out seasoning seasons and use salt free Increase water into to 80 oz a day. Eat meals on time No snacks between meals Keep food journal daily.  Teaching Method Utilized:  Visual Auditory Hands on  Handouts given during visit include:  The Plate Method   Low Salt Diet/Hypertension  Meal plan card  Weight loss tips.   Barriers to learning/adherence to lifestyle change: none  Demonstrated degree of understanding via:  Teach Back   Monitoring/Evaluation:  Dietary intake, exercise, , and body weight in 1 month(s).

## 2019-04-19 ENCOUNTER — Ambulatory Visit: Payer: Medicare Other | Admitting: Nutrition

## 2019-05-30 ENCOUNTER — Other Ambulatory Visit: Payer: Self-pay

## 2019-05-30 ENCOUNTER — Encounter: Payer: Self-pay | Admitting: Nutrition

## 2019-05-30 ENCOUNTER — Encounter: Payer: Medicare Other | Attending: Family Medicine | Admitting: Nutrition

## 2019-05-30 VITALS — Ht 66.0 in | Wt 219.5 lb

## 2019-05-30 DIAGNOSIS — E782 Mixed hyperlipidemia: Secondary | ICD-10-CM | POA: Insufficient documentation

## 2019-05-30 DIAGNOSIS — Z6835 Body mass index (BMI) 35.0-35.9, adult: Secondary | ICD-10-CM | POA: Insufficient documentation

## 2019-05-30 DIAGNOSIS — R635 Abnormal weight gain: Secondary | ICD-10-CM | POA: Insufficient documentation

## 2019-05-30 NOTE — Patient Instructions (Signed)
Goals  Continue to increase water intake to 84 oz per day Increase exercise to 30 minutes 5 times per week. 1200 calories 135 g CHO 90 g protein and 33 g of fat. Lose 1 lb per week. Download http://vang.com/ and mapmywalk.

## 2019-05-30 NOTE — Progress Notes (Signed)
  Medical Nutrition Therapy:  Appt start time: 1000 end time:  1030  Assessment:  Primary concerns today: Overweight, Hyperlipidemia and mild kidney issues. Has  Lost 11 lbs. Is keeping a food journal. Eating more fruits and vegetables, lean meats and more fish.Using more Mrs Deliah Boston and salt free substitutions. Working in yard a lot more. Avoiding snacks between meals. Watching portions. Reading food labels. Will work on increasing exercise.  PCP Dr. Holly Bodily. CMP Latest Ref Rng & Units 04/06/2019 03/28/2018 10/01/2017  Glucose 65 - 99 mg/dL 86 - 166(H)  BUN 7 - 25 mg/dL 22 17 15   Creatinine 0.50 - 0.99 mg/dL 1.10(H) 1.0 1.09(H)  Sodium 135 - 146 mmol/L 141 141 137  Potassium 3.5 - 5.3 mmol/L 4.9 4.7 4.6  Chloride 98 - 110 mmol/L 106 106 105  CO2 20 - 32 mmol/L 27 27(A) 24  Calcium 8.6 - 10.4 mg/dL 9.5 9.5 8.6(L)  Total Protein 6.1 - 8.1 g/dL 6.5 - -  Total Bilirubin 0.2 - 1.2 mg/dL 1.1 - -  Alkaline Phos 25 - 125 - 50 -  AST 10 - 35 U/L 19 17 -  ALT 6 - 29 U/L 19 15 -   Lipid Panel     Component Value Date/Time   CHOL 224 (H) 04/06/2019 0749   TRIG 76 04/06/2019 0749   HDL 80 04/06/2019 0749   CHOLHDL 2.8 04/06/2019 0749   VLDL 11 07/25/2013 1022   LDLCALC 127 (H) 04/06/2019 0749    Preferred Learning Style:   No preference indicated   Learning Readiness:  Ready  Change in progress   MEDICATIONS:    DIETARY INTAKE:   Eats 3 meals per day.  Usual physical activity: ADL, walks some.  Estimated energy needs: 1200-1400 calories 158 g carbohydrates 105 g protein 39 g fat  Progress Towards Goal(s):  In progress.   Nutritional Diagnosis:  NB-1.1 Food and nutrition-related knowledge deficit As related to Overweight, hyperlipidemia and renal insuffiencey..  As evidenced by LDL> 130.    Intervention:  Nutrition and weight loss education provided on My Plate, CHO counting, meal planning, portion sizes, timing of meals, avoiding snacks between meals unless having a low  blood sugar, target ranges for A1C and blood sugars, s taking medications as prescribed, benefits of exercising 60 minutes 3-4 times per week and prevention of DM. Low salt diet, DASH Diet. http://thomas.info/ .  Goals  Continue to increase water intake to 84 oz per day Increase exercise to 30 minutes 5 times per week. 1200 calories 135 g CHO 90 g protein and 33 g of fat. Lose 1 lb per week. Download http://vang.com/ and mapmywalk.    Teaching Method Utilized:  Visual Auditory Hands on  Handouts given during visit include:  The Plate Method   Low Salt Diet/Hypertension  Meal plan card  Weight loss tips.   Barriers to learning/adherence to lifestyle change: none  Demonstrated degree of understanding via:  Teach Back   Monitoring/Evaluation:  Dietary intake, exercise, , and body weight in 3 month(s).

## 2019-06-05 ENCOUNTER — Encounter: Payer: Self-pay | Admitting: Nutrition

## 2019-07-18 DIAGNOSIS — L719 Rosacea, unspecified: Secondary | ICD-10-CM | POA: Diagnosis not present

## 2019-07-18 DIAGNOSIS — E782 Mixed hyperlipidemia: Secondary | ICD-10-CM | POA: Diagnosis not present

## 2019-07-18 DIAGNOSIS — K635 Polyp of colon: Secondary | ICD-10-CM | POA: Diagnosis not present

## 2019-07-18 DIAGNOSIS — R928 Other abnormal and inconclusive findings on diagnostic imaging of breast: Secondary | ICD-10-CM | POA: Diagnosis not present

## 2019-08-26 ENCOUNTER — Other Ambulatory Visit: Payer: Self-pay

## 2019-08-26 ENCOUNTER — Encounter (HOSPITAL_COMMUNITY): Payer: Self-pay

## 2019-08-26 ENCOUNTER — Ambulatory Visit (HOSPITAL_COMMUNITY)
Admission: EM | Admit: 2019-08-26 | Discharge: 2019-08-26 | Disposition: A | Discharge status: Home / Self Care | Payer: Medicare Other | Attending: Physician Assistant | Admitting: Physician Assistant

## 2019-08-26 DIAGNOSIS — E785 Hyperlipidemia, unspecified: Secondary | ICD-10-CM | POA: Insufficient documentation

## 2019-08-26 DIAGNOSIS — R05 Cough: Secondary | ICD-10-CM | POA: Insufficient documentation

## 2019-08-26 DIAGNOSIS — Z79899 Other long term (current) drug therapy: Secondary | ICD-10-CM | POA: Insufficient documentation

## 2019-08-26 DIAGNOSIS — U071 COVID-19: Secondary | ICD-10-CM | POA: Diagnosis not present

## 2019-08-26 DIAGNOSIS — J029 Acute pharyngitis, unspecified: Secondary | ICD-10-CM | POA: Insufficient documentation

## 2019-08-26 DIAGNOSIS — J069 Acute upper respiratory infection, unspecified: Secondary | ICD-10-CM | POA: Diagnosis not present

## 2019-08-26 DIAGNOSIS — R519 Headache, unspecified: Secondary | ICD-10-CM | POA: Diagnosis not present

## 2019-08-26 DIAGNOSIS — E669 Obesity, unspecified: Secondary | ICD-10-CM | POA: Diagnosis not present

## 2019-08-26 DIAGNOSIS — Z6830 Body mass index (BMI) 30.0-30.9, adult: Secondary | ICD-10-CM | POA: Diagnosis not present

## 2019-08-26 LAB — POCT RAPID STREP A, ED / UC: Streptococcus, Group A Screen (Direct): NEGATIVE

## 2019-08-26 MED ORDER — BENZONATATE 100 MG PO CAPS
100.0000 mg | ORAL_CAPSULE | Freq: Three times a day (TID) | ORAL | 0 refills | Status: AC
Start: 2019-08-26 — End: ?

## 2019-08-26 MED ORDER — CEPACOL SORE THROAT 5.4 MG MT LOZG: 1.0000 | LOZENGE | OROMUCOSAL | 0 refills | Status: AC | PRN

## 2019-08-26 MED ORDER — FLUTICASONE PROPIONATE 50 MCG/ACT NA SUSP: 1.0000 | Freq: Every day | NASAL | 0 refills | Status: AC

## 2019-08-26 MED ORDER — CETIRIZINE HCL 10 MG PO TABS: 10.0000 mg | ORAL_TABLET | Freq: Every day | ORAL | 0 refills | Status: AC

## 2019-08-26 MED ORDER — ACETAMINOPHEN 325 MG PO TABS: 650.0000 mg | ORAL_TABLET | Freq: Four times a day (QID) | ORAL | 0 refills | Status: AC | PRN

## 2019-08-26 NOTE — Discharge Instructions (Signed)
Take the medications as prescribed -Tessalon/benzonatate for cough Flonase daily Cepacol as needed for sore throat Tylenol as needed for body ache and sore throat and headache Zyrtec daily  If symptoms or not improving over the next 1 week return or follow-up with your primary care  If severe symptoms of shortness of breath, vomiting high fevers return or go to the emergency department.  If your Covid-19 test is positive, you will receive a phone call from [facility] regarding your results. Negative test results are not called. Both positive and negative results area always visible on MyChart. If you do not have a MyChart account, sign up instructions are in your discharge papers.   Persons who are directed to care for themselves at home may discontinue isolation under the following conditions:   At least 10 days have passed since symptom onset and  At least 24 hours have passed without running a fever (this means without the use of fever-reducing medications) and  Other symptoms have improved.  Persons infected with COVID-19 who never develop symptoms may discontinue isolation and other precautions 10 days after the date of their first positive COVID-19 test.

## 2019-08-26 NOTE — ED Provider Notes (Signed)
[place]    CSN: [id number] Arrival date & time: 08/26/19  1234      History   Chief Complaint Chief Complaint  Patient presents with  . Headache  . Sinus Problem  . Cough  . Sore Throat    HPI Julie Humphrey is a 64 y.o. female.   Patient presents for sore throat, headache, cough and sinus congestion.  She reports symptoms been present for the last 3 to 4 days.  Her primary concern is the congestion and cough.  She also has had difficulty with sore throat and a tickle in her throat.  Cough has been nonproductive.  Denies fever and chills.  Frontal headache reported.  No nausea, vomiting or diarrhea.  Husband is here with similar symptoms.  Granddaughter recently had upper respiratory infection about a week ago.  Received during Covid vaccination.  Denies shortness of breath, chest pain.  Does endorse change in taste.     Past Medical History:  Diagnosis Date  . Rectocele 03/29/2012    Patient Active Problem List   Diagnosis Date Noted  . Hyperlipidemia 03/05/2019  . Wellness examination 09/11/2018  . S/P laparoscopic appendectomy 09/30/2017  . Acute appendicitis   . Obesity (BMI 30.0-34.9) 10/10/2013  . Ganglion cyst 05/09/2013  . Weight loss due to medication 05/09/2013  . Weight gain 04/04/2013  . Rectocele 03/29/2012    Past Surgical History:  Procedure Laterality Date  . APPENDECTOMY    . arm surgery Left    paltes and screws  . COLONOSCOPY N/A 12/20/2018   Procedure: COLONOSCOPY;  Surgeon: Rogene Houston, MD;  [facility];  Service: Endoscopy;  Laterality: N/A;  12/20/2018  . LAPAROSCOPIC APPENDECTOMY N/A 09/30/2017   Procedure: APPENDECTOMY LAPAROSCOPIC;  Surgeon: Aviva Signs, MD;  Location: [facility];  Service: General;  Laterality: N/A;  . POLYPECTOMY  12/20/2018   Procedure: POLYPECTOMY;  Surgeon: Rogene Houston, MD;  Location: [facility];  Service: Endoscopy;;  . TONSILLECTOMY    . TUBAL LIGATION Bilateral 1987    OB  History   No obstetric history on file.      Home Medications    Prior to Admission medications   Medication Sig Start Date End Date Taking? Authorizing Provider  acetaminophen (TYLENOL) 325 MG tablet Take 2 tablets (650 mg total) by mouth every 6 (six) hours as needed. 08/26/19   Iosefa Weintraub, Marguerita Beards, PA-C  Ascorbic Acid (VITAMIN C) 1000 MG tablet Take 1,000 mg by mouth daily.    [provider]  b complex vitamins tablet Take 1 tablet by mouth daily.    [provider]  benzonatate (TESSALON) 100 MG capsule Take 1 capsule (100 mg total) by mouth every 8 (eight) hours. 08/26/19   Gearald Stonebraker, Marguerita Beards, PA-C  Biotin 5000 MCG TABS Take 5,000 mcg by mouth daily.    [provider]  cetirizine (ZYRTEC ALLERGY) 10 MG tablet Take 1 tablet (10 mg total) by mouth daily. 08/26/19   Aishah Teffeteller, Marguerita Beards, PA-C  Coenzyme Q10 (COQ10) 100 MG CAPS Take 100 mg by mouth daily.    [provider]  fluticasone (FLONASE) 50 MCG/ACT nasal spray Place 1 spray into both nostrils daily. 08/26/19   Willi Borowiak, Marguerita Beards, PA-C  Melatonin 10 MG TABS Take 10 mg by mouth at bedtime.     [provider]  Menthol (CEPACOL SORE THROAT) 5.4 MG LOZG Use as directed 1 lozenge (5.4 mg total) in the mouth or throat every 2 (two) hours  as needed. 08/26/19   Abbey Veith, Marguerita Beards, PA-C  Omega-3 Fatty Acids (FISH OIL) 1200 MG CAPS Take 1,200 mg by mouth daily.    [provider]  Polyethyl Glycol-Propyl Glycol (SYSTANE) 0.4-0.3 % SOLN Place 1 drop into both eyes 3 (three) times daily as needed (dry/irritated eyes).    [provider]  tretinoin (RETIN-A) 0.025 % cream Apply topically at bedtime. Patient taking differently: Apply 1 application topically daily.  03/29/12   Jonnie Kind, MD  TURMERIC PO Take 2,000 mg by mouth daily. 1000 mg/capsule    [provider]  Zinc 50 MG TABS Take 50 mg by mouth daily.    [provider]    Family History Family History  Problem Relation Age of  Onset  . Hyperlipidemia Mother   . Hypertension Mother   . Heart attack Father   . Diabetes Father   . Cancer Father        kidney  . CAD Maternal Grandmother   . Heart disease Maternal Grandmother   . CAD Paternal Grandmother   . Diabetes Paternal Grandmother   . Heart disease Paternal Grandmother   . Heart disease Maternal Grandfather   . Diabetes Paternal Grandfather   . Cancer Paternal Grandfather   . Diabetes Paternal Aunt     Social History Social History   Tobacco Use  . Smoking status: Never Smoker  . Smokeless tobacco: Never Used  Vaping Use  . Vaping Use: Never used  Substance Use Topics  . Alcohol use: Yes    Alcohol/week: 2.0 standard drinks    Types: 2 Glasses of wine per week  . Drug use: No     Allergies   Patient has no known allergies.   Review of Systems Review of Systems   Physical Exam Triage Vital Signs ED Triage Vitals  Enc Vitals Group     BP 08/26/19 1440 (!) 149/92     Pulse Rate 08/26/19 1440 65     Resp 08/26/19 1440 16     Temp 08/26/19 1440 98.6 F (37 C)     Temp Source 08/26/19 1440 Oral     SpO2 08/26/19 1440 100 %     Weight --      Height --      Head Circumference --      Peak Flow --      Pain Score 08/26/19 1444 4     Pain Loc --      Pain Edu? --      Excl. in [place]? --    No data found.  Updated Vital Signs BP (!) 145/68 (BP Location: Right Arm)   Pulse (!) 59   Temp 98.6 F (37 C) (Oral)   Resp 16   LMP 10/30/2006   SpO2 100%   Visual Acuity Right Eye Distance:   Left Eye Distance:   Bilateral Distance:    Right Eye Near:   Left Eye Near:    Bilateral Near:     Physical Exam Vitals and nursing note reviewed.  Constitutional:      General: She is not in acute distress.    Appearance: She is well-developed. She is not ill-appearing.  HENT:     Head: Normocephalic and atraumatic.     Right Ear: Tympanic membrane normal.     Left Ear: Tympanic membrane normal.     Nose: Congestion and  rhinorrhea present.     Mouth/Throat:     Mouth: Mucous membranes are moist.  Pharynx: No oropharyngeal exudate or posterior oropharyngeal erythema.     Comments: Mild postnasal drip Eyes:     Conjunctiva/sclera: Conjunctivae normal.     Pupils: Pupils are equal, round, and reactive to light.  Cardiovascular:     Rate and Rhythm: Normal rate and regular rhythm.     Heart sounds: No murmur heard.   Pulmonary:     Effort: Pulmonary effort is normal. No respiratory distress.     Breath sounds: Normal breath sounds. No wheezing, rhonchi or rales.  Abdominal:     Palpations: Abdomen is soft.     Tenderness: There is no abdominal tenderness.  Musculoskeletal:     Cervical back: Normal range of motion and neck supple. No rigidity or tenderness.     Right lower leg: No edema.     Left lower leg: No edema.  Skin:    General: Skin is warm and dry.  Neurological:     Mental Status: She is alert.      UC Treatments / Results  Labs (all labs ordered are listed, but only abnormal results are displayed) Labs Reviewed  SARS CORONAVIRUS 2 (TAT 6-24 HRS)  CULTURE, GROUP A STREP [facility])  POCT RAPID STREP A, ED / UC    EKG   Radiology No results found.  Procedures Procedures (including critical care time)  Medications Ordered in UC Medications - No data to display  Initial Impression / Assessment and Plan / UC Course  I have reviewed the triage vital signs and the nursing notes.  Pertinent labs & imaging results that were available during my care of the patient were reviewed by me and considered in my medical decision making (see chart for details).     #Viral URI Patient is a 64 year old presenting with viral upper respiratory symptoms.  Rapid strep negative.  Covid sent.  Reassuring vital signs and exam.  We will treat symptomatically.  Discussed return, follow-up and emergency department precautions.  Patient verbalized understanding plan of care.   Final Clinical  Impressions(s) / UC Diagnoses   Final diagnoses:  Viral upper respiratory tract infection     Discharge Instructions     Take the medications as prescribed -Tessalon/benzonatate for cough Flonase daily Cepacol as needed for sore throat Tylenol as needed for body ache and sore throat and headache Zyrtec daily  If symptoms or not improving over the next 1 week return or follow-up with your primary care  If severe symptoms of shortness of breath, vomiting high fevers return or go to the emergency department.  If your Covid-19 test is positive, you will receive a phone call from [facility] regarding your results. Negative test results are not called. Both positive and negative results area always visible on MyChart. If you do not have a MyChart account, sign up instructions are in your discharge papers.   Persons who are directed to care for themselves at home may discontinue isolation under the following conditions:  . At least 10 days have passed since symptom onset and . At least 24 hours have passed without running a fever (this means without the use of fever-reducing medications) and . Other symptoms have improved.  Persons infected with COVID-19 who never develop symptoms may discontinue isolation and other precautions 10 days after the date of their first positive COVID-19 test.     ED Prescriptions    Medication Sig Dispense Auth. Provider   benzonatate (TESSALON) 100 MG capsule Take 1 capsule (100 mg total) by mouth every 8 (  eight) hours. 21 capsule Atira Borello, Marguerita Beards, PA-C   fluticasone (FLONASE) 50 MCG/ACT nasal spray Place 1 spray into both nostrils daily. 15.8 mL Delphin Funes, Marguerita Beards, PA-C   cetirizine (ZYRTEC ALLERGY) 10 MG tablet Take 1 tablet (10 mg total) by mouth daily. 30 tablet Teana Lindahl, Marguerita Beards, PA-C   Menthol (CEPACOL SORE THROAT) 5.4 MG LOZG Use as directed 1 lozenge (5.4 mg total) in the mouth or throat every 2 (two) hours as needed. 30 lozenge Maaliyah Adolph, Marguerita Beards, PA-C    acetaminophen (TYLENOL) 325 MG tablet Take 2 tablets (650 mg total) by mouth every 6 (six) hours as needed. 30 tablet Layanna Charo, Marguerita Beards, PA-C     PDMP not reviewed this encounter.   Purnell Shoemaker, PA-C 08/26/19 1946

## 2019-08-26 NOTE — ED Triage Notes (Signed)
Patient is here today with complaints of a headache, sore throat, non productive cough, sinus pressure, nasal congestion - clear for the past three days. Patient states she has tried OTC Tylenol, Mucinex D and Coricidin with no relief.  Patient denies: fever, shortness of breath, nausea, vomiting , diarrhea

## 2019-08-27 LAB — SARS CORONAVIRUS 2 (TAT 6-24 HRS): SARS Coronavirus 2: POSITIVE — AB

## 2019-08-28 ENCOUNTER — Other Ambulatory Visit: Payer: Self-pay | Admitting: Physician Assistant

## 2019-08-28 DIAGNOSIS — U071 COVID-19: Secondary | ICD-10-CM

## 2019-08-28 DIAGNOSIS — R030 Elevated blood-pressure reading, without diagnosis of hypertension: Secondary | ICD-10-CM

## 2019-08-28 NOTE — Progress Notes (Signed)
I connected by phone with Julie Humphrey on 08/28/2019 at 7:07 PM to discuss the potential use of a new treatment for mild to moderate COVID-19 viral infection in non-hospitalized patients.  This patient is a 64 y.o. female that meets the FDA criteria for Emergency Use Authorization of COVID monoclonal antibody casirivimab/imdevimab.  Has a (+) direct SARS-CoV-2 viral test result  Has mild or moderate COVID-19   Is NOT hospitalized due to COVID-19  Is within 10 days of symptom onset  Has at least one of the high risk factor(s) for progression to severe COVID-19 and/or hospitalization as defined in EUA.  Specific high risk criteria : Older age (>/= 66 yo)   I have spoken and communicated the following to the patient or parent/caregiver regarding COVID monoclonal antibody treatment:  1. FDA has authorized the emergency use for the treatment of mild to moderate COVID-19 in adults and pediatric patients with positive results of direct SARS-CoV-2 viral testing who are 18 years of age and older weighing at least 40 kg, and who are at high risk for progressing to severe COVID-19 and/or hospitalization.  2. The significant known and potential risks and benefits of COVID monoclonal antibody, and the extent to which such potential risks and benefits are unknown.  3. Information on available alternative treatments and the risks and benefits of those alternatives, including clinical trials.  4. Patients treated with COVID monoclonal antibody should continue to self-isolate and use infection control measures (e.g., wear mask, isolate, social distance, avoid sharing personal items, clean and disinfect "high touch" surfaces, and frequent handwashing) according to CDC guidelines.   5. The patient or parent/caregiver has the option to accept or refuse COVID monoclonal antibody treatment.  After reviewing this information with the patient, The patient agreed to proceed with receiving casirivimab\imdevimab  infusion and will be provided a copy of the Fact sheet prior to receiving the infusion.   Julie Humphrey 08/28/2019 7:07 PM

## 2019-08-29 ENCOUNTER — Ambulatory Visit (HOSPITAL_COMMUNITY)
Admission: RE | Admit: 2019-08-29 | Discharge: 2019-08-29 | Disposition: A | Discharge status: Home / Self Care | Payer: Medicare Other | Source: Ambulatory Visit | Attending: Pulmonary Disease | Admitting: Pulmonary Disease

## 2019-08-29 DIAGNOSIS — U071 COVID-19: Secondary | ICD-10-CM | POA: Insufficient documentation

## 2019-08-29 DIAGNOSIS — Z23 Encounter for immunization: Secondary | ICD-10-CM | POA: Diagnosis not present

## 2019-08-29 LAB — CULTURE, GROUP A STREP (THRC)

## 2019-08-29 MED ORDER — SODIUM CHLORIDE 0.9 % IV SOLN
1200.0000 mg | Freq: Once | INTRAVENOUS | Status: AC
  Administered 2019-08-29: 1200 mg via INTRAVENOUS
  Filled 2019-08-29: qty 10

## 2019-08-29 MED ORDER — FAMOTIDINE IN NACL 20-0.9 MG/50ML-% IV SOLN: 20.0000 mg | Freq: Once | INTRAVENOUS | Status: DC | PRN

## 2019-08-29 MED ORDER — SODIUM CHLORIDE 0.9 % IV SOLN: INTRAVENOUS | Status: DC | PRN

## 2019-08-29 MED ORDER — ALBUTEROL SULFATE HFA 108 (90 BASE) MCG/ACT IN AERS: 2.0000 | INHALATION_SPRAY | Freq: Once | RESPIRATORY_TRACT | Status: DC | PRN

## 2019-08-29 MED ORDER — METHYLPREDNISOLONE SODIUM SUCC 125 MG IJ SOLR: 125.0000 mg | Freq: Once | INTRAMUSCULAR | Status: DC | PRN

## 2019-08-29 MED ORDER — EPINEPHRINE 0.3 MG/0.3ML IJ SOAJ: 0.3000 mg | Freq: Once | INTRAMUSCULAR | Status: DC | PRN

## 2019-08-29 MED ORDER — DIPHENHYDRAMINE HCL 50 MG/ML IJ SOLN: 50.0000 mg | Freq: Once | INTRAMUSCULAR | Status: DC | PRN

## 2019-08-29 NOTE — Discharge Instructions (Signed)

## 2019-08-29 NOTE — Progress Notes (Signed)
  Diagnosis: COVID-19  Physician: Dr Carey Bullocks  Procedure: [facility] Infusion [facility] Med: casirivimab\imdevimab infusion - Provided patient with casirivimab\imdevimab fact sheet for patients, parents and caregivers prior to infusion.  Complications: No immediate complications noted.  Discharge: Discharged home   Julie Humphrey 08/29/2019

## 2019-08-30 ENCOUNTER — Ambulatory Visit: Payer: Medicare Other | Admitting: Nutrition

## 2019-10-15 DIAGNOSIS — E782 Mixed hyperlipidemia: Secondary | ICD-10-CM | POA: Diagnosis not present

## 2019-10-15 DIAGNOSIS — E559 Vitamin D deficiency, unspecified: Secondary | ICD-10-CM | POA: Diagnosis not present

## 2019-10-15 DIAGNOSIS — D529 Folate deficiency anemia, unspecified: Secondary | ICD-10-CM | POA: Diagnosis not present

## 2019-10-15 DIAGNOSIS — D519 Vitamin B12 deficiency anemia, unspecified: Secondary | ICD-10-CM | POA: Diagnosis not present

## 2019-10-15 DIAGNOSIS — Z1329 Encounter for screening for other suspected endocrine disorder: Secondary | ICD-10-CM | POA: Diagnosis not present

## 2019-10-15 DIAGNOSIS — Z1321 Encounter for screening for nutritional disorder: Secondary | ICD-10-CM | POA: Diagnosis not present

## 2019-10-19 DIAGNOSIS — E782 Mixed hyperlipidemia: Secondary | ICD-10-CM | POA: Diagnosis not present

## 2019-10-19 DIAGNOSIS — Z23 Encounter for immunization: Secondary | ICD-10-CM | POA: Diagnosis not present

## 2019-10-19 DIAGNOSIS — K635 Polyp of colon: Secondary | ICD-10-CM | POA: Diagnosis not present

## 2019-10-19 DIAGNOSIS — Z1389 Encounter for screening for other disorder: Secondary | ICD-10-CM | POA: Diagnosis not present

## 2019-10-19 DIAGNOSIS — M8580 Other specified disorders of bone density and structure, unspecified site: Secondary | ICD-10-CM | POA: Diagnosis not present

## 2020-01-08 ENCOUNTER — Ambulatory Visit
Admission: EM | Admit: 2020-01-08 | Discharge: 2020-01-08 | Disposition: A | Discharge status: Home / Self Care | Payer: Medicare Other | Attending: Physician Assistant | Admitting: Physician Assistant

## 2020-01-08 ENCOUNTER — Other Ambulatory Visit: Payer: Self-pay

## 2020-01-08 DIAGNOSIS — J029 Acute pharyngitis, unspecified: Secondary | ICD-10-CM

## 2020-01-08 LAB — POCT RAPID STREP A (OFFICE): Rapid Strep A Screen: NEGATIVE

## 2020-01-08 NOTE — ED Triage Notes (Signed)
Pt presents with complaints of sore throat x 4 days. Denies fever or any other symptoms.

## 2020-01-08 NOTE — ED Provider Notes (Signed)
[facility]    CSN: [id number] Arrival date & time: 01/08/20  1010      History   Chief Complaint Chief Complaint  Patient presents with  . Sore Throat    HPI Julie Humphrey is a 64 y.o. female.   The history is provided by the patient. No language interpreter was used.  Sore Throat This is a new problem. The current episode started 2 days ago. The problem occurs constantly. The problem has not changed since onset.Pertinent negatives include no chest pain, no abdominal pain and no headaches. Nothing aggravates the symptoms. Nothing relieves the symptoms. She has tried nothing for the symptoms. The treatment provided no relief.  Pt denies fever or cough.  Pt had covid in august.  Pt reports similar symptoms with strep   Past Medical History:  Diagnosis Date  . Rectocele 03/29/2012    Patient Active Problem List   Diagnosis Date Noted  . Hyperlipidemia 03/05/2019  . Wellness examination 09/11/2018  . S/P laparoscopic appendectomy 09/30/2017  . Acute appendicitis   . Obesity (BMI 30.0-34.9) 10/10/2013  . Ganglion cyst 05/09/2013  . Weight loss due to medication 05/09/2013  . Weight gain 04/04/2013  . Rectocele 03/29/2012    Past Surgical History:  Procedure Laterality Date  . APPENDECTOMY    . arm surgery Left    paltes and screws  . COLONOSCOPY N/A 12/20/2018   Procedure: COLONOSCOPY;  Surgeon: Rogene Houston, MD;  [facility];  Service: Endoscopy;  Laterality: N/A;  12/20/2018  . LAPAROSCOPIC APPENDECTOMY N/A 09/30/2017   Procedure: APPENDECTOMY LAPAROSCOPIC;  Surgeon: Aviva Signs, MD;  Location: [facility];  Service: General;  Laterality: N/A;  . POLYPECTOMY  12/20/2018   Procedure: POLYPECTOMY;  Surgeon: Rogene Houston, MD;  [facility];  Service: Endoscopy;;  . TONSILLECTOMY    . TUBAL LIGATION Bilateral 1987    OB History   No obstetric history on file.      Home Medications    Prior to Admission medications    Medication Sig Start Date End Date Taking? Authorizing Provider  acetaminophen (TYLENOL) 325 MG tablet Take 2 tablets (650 mg total) by mouth every 6 (six) hours as needed. 08/26/19   Darr, Edison Nasuti, PA-C  Ascorbic Acid (VITAMIN C) 1000 MG tablet Take 1,000 mg by mouth daily.    [provider]  b complex vitamins tablet Take 1 tablet by mouth daily.    [provider]  benzonatate (TESSALON) 100 MG capsule Take 1 capsule (100 mg total) by mouth every 8 (eight) hours. 08/26/19   Darr, Edison Nasuti, PA-C  Biotin 5000 MCG TABS Take 5,000 mcg by mouth daily.    [provider]  cetirizine (ZYRTEC ALLERGY) 10 MG tablet Take 1 tablet (10 mg total) by mouth daily. 08/26/19   Darr, Edison Nasuti, PA-C  Coenzyme Q10 (COQ10) 100 MG CAPS Take 100 mg by mouth daily.    [provider]  fluticasone (FLONASE) 50 MCG/ACT nasal spray Place 1 spray into both nostrils daily. 08/26/19   Darr, Edison Nasuti, PA-C  Melatonin 10 MG TABS Take 10 mg by mouth at bedtime.     [provider]  Menthol (CEPACOL SORE THROAT) 5.4 MG LOZG Use as directed 1 lozenge (5.4 mg total) in the mouth or throat every 2 (two) hours as needed. 08/26/19   Darr, Edison Nasuti, PA-C  Omega-3 Fatty Acids (FISH OIL) 1200 MG CAPS Take 1,200 mg by mouth daily.    [provider]  Polyethyl  Glycol-Propyl Glycol (SYSTANE) 0.4-0.3 % SOLN Place 1 drop into both eyes 3 (three) times daily as needed (dry/irritated eyes).    [provider]  tretinoin (RETIN-A) 0.025 % cream Apply topically at bedtime. Patient taking differently: Apply 1 application topically daily.  03/29/12   Jonnie Kind, MD  TURMERIC PO Take 2,000 mg by mouth daily. 1000 mg/capsule    [provider]  Zinc 50 MG TABS Take 50 mg by mouth daily.    [provider]    Family History Family History  Problem Relation Age of Onset  . Hyperlipidemia Mother   . Hypertension Mother   . Heart attack Father   . Diabetes Father   . Cancer  Father        kidney  . CAD Maternal Grandmother   . Heart disease Maternal Grandmother   . CAD Paternal Grandmother   . Diabetes Paternal Grandmother   . Heart disease Paternal Grandmother   . Heart disease Maternal Grandfather   . Diabetes Paternal Grandfather   . Cancer Paternal Grandfather   . Diabetes Paternal Aunt     Social History Social History   Tobacco Use  . Smoking status: Never Smoker  . Smokeless tobacco: Never Used  Vaping Use  . Vaping Use: Never used  Substance Use Topics  . Alcohol use: Yes    Alcohol/week: 2.0 standard drinks    Types: 2 Glasses of wine per week  . Drug use: No     Allergies   Patient has no known allergies.   Review of Systems Review of Systems  Constitutional: Positive for fever.  Cardiovascular: Negative for chest pain.  Gastrointestinal: Negative for abdominal pain.  Neurological: Negative for headaches.  All other systems reviewed and are negative.    Physical Exam Triage Vital Signs ED Triage Vitals  Enc Vitals Group     BP 01/08/20 1106 (!) 154/95     Pulse Rate 01/08/20 1104 60     Resp 01/08/20 1104 19     Temp 01/08/20 1104 98.3 F (36.8 C)     Temp src --      SpO2 01/08/20 1104 98 %     Weight --      Height --      Head Circumference --      Peak Flow --      Pain Score 01/08/20 1103 4     Pain Loc --      Pain Edu? --      Excl. in [place]? --    No data found.  Updated Vital Signs BP (!) 154/95   Pulse 60   Temp 98.3 F (36.8 C)   Resp 19   LMP 10/30/2006   SpO2 98%   Visual Acuity Right Eye Distance:   Left Eye Distance:   Bilateral Distance:    Right Eye Near:   Left Eye Near:    Bilateral Near:     Physical Exam Vitals and nursing note reviewed.  Constitutional:      Appearance: She is well-developed and well-nourished.  HENT:     Head: Normocephalic.     Mouth/Throat:     Tonsils: 2+ on the right. 2+ on the left.  Eyes:     Extraocular Movements: EOM normal.      Conjunctiva/sclera: Conjunctivae normal.  Cardiovascular:     Rate and Rhythm: Normal rate.  Pulmonary:     Effort: Pulmonary effort is normal.  Abdominal:     General:  There is no distension.     Palpations: Abdomen is soft.  Musculoskeletal:        General: Normal range of motion.     Cervical back: Normal range of motion.  Skin:    General: Skin is warm.  Neurological:     General: No focal deficit present.     Mental Status: She is alert and oriented to person, place, and time.  Psychiatric:        Mood and Affect: Mood and affect normal.      UC Treatments / Results  Labs (all labs ordered are listed, but only abnormal results are displayed) Labs Reviewed  NOVEL CORONAVIRUS, NAA  POCT RAPID STREP A (OFFICE)    EKG   Radiology No results found.  Procedures Procedures (including critical care time)  Medications Ordered in UC Medications - No data to display  Initial Impression / Assessment and Plan / UC Course  I have reviewed the triage vital signs and the nursing notes.  Pertinent labs & imaging results that were available during my care of the patient were reviewed by me and considered in my medical decision making (see chart for details).     Strep is negative  Covid is pending.  I suspect viral illness.  Pt advised to follow up with her MD for recheck Final Clinical Impressions(s) / UC Diagnoses   Final diagnoses:  Sore throat     Discharge Instructions     Tylenol every 4 hours.  Return if any problems.    ED Prescriptions    None     PDMP not reviewed this encounter.  An After Visit Summary was printed and given to the patient.    Elson Areas, [place] 01/08/20 1217

## 2020-01-08 NOTE — Discharge Instructions (Signed)
Tylenol every 4 hours.  Return if any problems.  

## 2020-01-09 LAB — NOVEL CORONAVIRUS, NAA: SARS-CoV-2, NAA: NOT DETECTED

## 2020-01-09 LAB — SARS-COV-2, NAA 2 DAY TAT

## 2020-03-27 ENCOUNTER — Other Ambulatory Visit (HOSPITAL_COMMUNITY): Payer: Self-pay | Admitting: Family Medicine

## 2020-03-27 DIAGNOSIS — Z1231 Encounter for screening mammogram for malignant neoplasm of breast: Secondary | ICD-10-CM

## 2020-03-31 ENCOUNTER — Other Ambulatory Visit: Payer: Self-pay

## 2020-03-31 ENCOUNTER — Ambulatory Visit (HOSPITAL_COMMUNITY)
Admission: RE | Admit: 2020-03-31 | Discharge: 2020-03-31 | Disposition: A | Discharge status: Home / Self Care | Payer: Medicare Other | Source: Ambulatory Visit | Attending: Family Medicine | Admitting: Family Medicine

## 2020-03-31 DIAGNOSIS — D225 Melanocytic nevi of trunk: Secondary | ICD-10-CM | POA: Diagnosis not present

## 2020-03-31 DIAGNOSIS — Z1283 Encounter for screening for malignant neoplasm of skin: Secondary | ICD-10-CM | POA: Diagnosis not present

## 2020-03-31 DIAGNOSIS — Z1231 Encounter for screening mammogram for malignant neoplasm of breast: Secondary | ICD-10-CM | POA: Insufficient documentation

## 2020-03-31 DIAGNOSIS — D0472 Carcinoma in situ of skin of left lower limb, including hip: Secondary | ICD-10-CM | POA: Diagnosis not present

## 2020-03-31 DIAGNOSIS — L570 Actinic keratosis: Secondary | ICD-10-CM | POA: Diagnosis not present

## 2020-03-31 DIAGNOSIS — X32XXXD Exposure to sunlight, subsequent encounter: Secondary | ICD-10-CM | POA: Diagnosis not present

## 2020-04-24 DIAGNOSIS — E782 Mixed hyperlipidemia: Secondary | ICD-10-CM | POA: Diagnosis not present

## 2020-04-24 DIAGNOSIS — Z1321 Encounter for screening for nutritional disorder: Secondary | ICD-10-CM | POA: Diagnosis not present

## 2020-04-24 DIAGNOSIS — D649 Anemia, unspecified: Secondary | ICD-10-CM | POA: Diagnosis not present

## 2020-04-24 DIAGNOSIS — E7849 Other hyperlipidemia: Secondary | ICD-10-CM | POA: Diagnosis not present

## 2020-04-29 DIAGNOSIS — R252 Cramp and spasm: Secondary | ICD-10-CM | POA: Diagnosis not present

## 2020-04-29 DIAGNOSIS — Z0001 Encounter for general adult medical examination with abnormal findings: Secondary | ICD-10-CM | POA: Diagnosis not present

## 2020-04-29 DIAGNOSIS — K635 Polyp of colon: Secondary | ICD-10-CM | POA: Diagnosis not present

## 2020-04-29 DIAGNOSIS — E7849 Other hyperlipidemia: Secondary | ICD-10-CM | POA: Diagnosis not present

## 2020-04-29 DIAGNOSIS — Z23 Encounter for immunization: Secondary | ICD-10-CM | POA: Diagnosis not present

## 2020-04-29 DIAGNOSIS — M8580 Other specified disorders of bone density and structure, unspecified site: Secondary | ICD-10-CM | POA: Diagnosis not present

## 2020-05-29 DIAGNOSIS — Z08 Encounter for follow-up examination after completed treatment for malignant neoplasm: Secondary | ICD-10-CM | POA: Diagnosis not present

## 2020-05-29 DIAGNOSIS — Z85828 Personal history of other malignant neoplasm of skin: Secondary | ICD-10-CM | POA: Diagnosis not present

## 2020-05-29 DIAGNOSIS — L820 Inflamed seborrheic keratosis: Secondary | ICD-10-CM | POA: Diagnosis not present

## 2020-05-29 DIAGNOSIS — L570 Actinic keratosis: Secondary | ICD-10-CM | POA: Diagnosis not present

## 2020-05-29 DIAGNOSIS — X32XXXD Exposure to sunlight, subsequent encounter: Secondary | ICD-10-CM | POA: Diagnosis not present

## 2020-08-04 DIAGNOSIS — L905 Scar conditions and fibrosis of skin: Secondary | ICD-10-CM | POA: Diagnosis not present

## 2020-08-04 DIAGNOSIS — L570 Actinic keratosis: Secondary | ICD-10-CM | POA: Diagnosis not present

## 2020-08-04 DIAGNOSIS — Z08 Encounter for follow-up examination after completed treatment for malignant neoplasm: Secondary | ICD-10-CM | POA: Diagnosis not present

## 2020-08-04 DIAGNOSIS — Z85828 Personal history of other malignant neoplasm of skin: Secondary | ICD-10-CM | POA: Diagnosis not present

## 2020-08-04 DIAGNOSIS — X32XXXD Exposure to sunlight, subsequent encounter: Secondary | ICD-10-CM | POA: Diagnosis not present

## 2020-09-27 DIAGNOSIS — H109 Unspecified conjunctivitis: Secondary | ICD-10-CM | POA: Diagnosis not present

## 2020-11-17 DIAGNOSIS — Z1329 Encounter for screening for other suspected endocrine disorder: Secondary | ICD-10-CM | POA: Diagnosis not present

## 2020-11-17 DIAGNOSIS — Z1159 Encounter for screening for other viral diseases: Secondary | ICD-10-CM | POA: Diagnosis not present

## 2020-11-17 DIAGNOSIS — Z1321 Encounter for screening for nutritional disorder: Secondary | ICD-10-CM | POA: Diagnosis not present

## 2020-11-17 DIAGNOSIS — E559 Vitamin D deficiency, unspecified: Secondary | ICD-10-CM | POA: Diagnosis not present

## 2020-11-17 DIAGNOSIS — E7849 Other hyperlipidemia: Secondary | ICD-10-CM | POA: Diagnosis not present

## 2020-11-17 DIAGNOSIS — E782 Mixed hyperlipidemia: Secondary | ICD-10-CM | POA: Diagnosis not present

## 2020-11-20 DIAGNOSIS — K635 Polyp of colon: Secondary | ICD-10-CM | POA: Diagnosis not present

## 2020-11-20 DIAGNOSIS — M8580 Other specified disorders of bone density and structure, unspecified site: Secondary | ICD-10-CM | POA: Diagnosis not present

## 2020-11-20 DIAGNOSIS — Z0001 Encounter for general adult medical examination with abnormal findings: Secondary | ICD-10-CM | POA: Diagnosis not present

## 2020-11-20 DIAGNOSIS — Z23 Encounter for immunization: Secondary | ICD-10-CM | POA: Diagnosis not present

## 2020-11-20 DIAGNOSIS — E7849 Other hyperlipidemia: Secondary | ICD-10-CM | POA: Diagnosis not present

## 2021-02-17 ENCOUNTER — Other Ambulatory Visit (HOSPITAL_COMMUNITY): Payer: Self-pay | Admitting: Family Medicine

## 2021-02-17 DIAGNOSIS — Z1231 Encounter for screening mammogram for malignant neoplasm of breast: Secondary | ICD-10-CM

## 2021-03-09 ENCOUNTER — Other Ambulatory Visit (HOSPITAL_COMMUNITY): Payer: Self-pay | Admitting: Family Medicine

## 2021-03-09 DIAGNOSIS — N6310 Unspecified lump in the right breast, unspecified quadrant: Secondary | ICD-10-CM

## 2021-03-16 DIAGNOSIS — I889 Nonspecific lymphadenitis, unspecified: Secondary | ICD-10-CM | POA: Diagnosis not present

## 2021-03-16 DIAGNOSIS — M542 Cervicalgia: Secondary | ICD-10-CM | POA: Diagnosis not present

## 2021-04-02 ENCOUNTER — Other Ambulatory Visit: Payer: Self-pay

## 2021-04-02 ENCOUNTER — Ambulatory Visit (HOSPITAL_COMMUNITY): Payer: Medicare Other

## 2021-04-02 ENCOUNTER — Ambulatory Visit (HOSPITAL_COMMUNITY)
Admission: RE | Admit: 2021-04-02 | Discharge: 2021-04-02 | Disposition: A | Discharge status: Home / Self Care | Payer: Medicare Other | Source: Ambulatory Visit | Attending: Family Medicine | Admitting: Family Medicine

## 2021-04-02 ENCOUNTER — Encounter (HOSPITAL_COMMUNITY): Payer: Self-pay

## 2021-04-02 DIAGNOSIS — N6310 Unspecified lump in the right breast, unspecified quadrant: Secondary | ICD-10-CM

## 2021-04-02 DIAGNOSIS — N644 Mastodynia: Secondary | ICD-10-CM | POA: Diagnosis not present

## 2021-04-02 DIAGNOSIS — R922 Inconclusive mammogram: Secondary | ICD-10-CM | POA: Diagnosis not present

## 2021-04-02 DIAGNOSIS — N6459 Other signs and symptoms in breast: Secondary | ICD-10-CM | POA: Diagnosis not present

## 2021-06-01 DIAGNOSIS — Z0001 Encounter for general adult medical examination with abnormal findings: Secondary | ICD-10-CM | POA: Diagnosis not present

## 2021-06-01 DIAGNOSIS — E7849 Other hyperlipidemia: Secondary | ICD-10-CM | POA: Diagnosis not present

## 2021-06-01 DIAGNOSIS — E782 Mixed hyperlipidemia: Secondary | ICD-10-CM | POA: Diagnosis not present

## 2021-06-01 DIAGNOSIS — R5383 Other fatigue: Secondary | ICD-10-CM | POA: Diagnosis not present

## 2021-06-05 DIAGNOSIS — E7849 Other hyperlipidemia: Secondary | ICD-10-CM | POA: Diagnosis not present

## 2021-06-05 DIAGNOSIS — K635 Polyp of colon: Secondary | ICD-10-CM | POA: Diagnosis not present

## 2021-06-05 DIAGNOSIS — Z0001 Encounter for general adult medical examination with abnormal findings: Secondary | ICD-10-CM | POA: Diagnosis not present

## 2021-06-05 DIAGNOSIS — M8580 Other specified disorders of bone density and structure, unspecified site: Secondary | ICD-10-CM | POA: Diagnosis not present

## 2021-06-05 IMAGING — MG DIGITAL SCREENING BILAT W/ TOMO W/ CAD
8 series · 8 of 24 positions shown · non-contrast
Comparison: Previous exam(s).

CLINICAL DATA: Screening.

EXAM:
DIGITAL SCREENING BILATERAL MAMMOGRAM WITH TOMO AND CAD

[R MLO synth-2D]
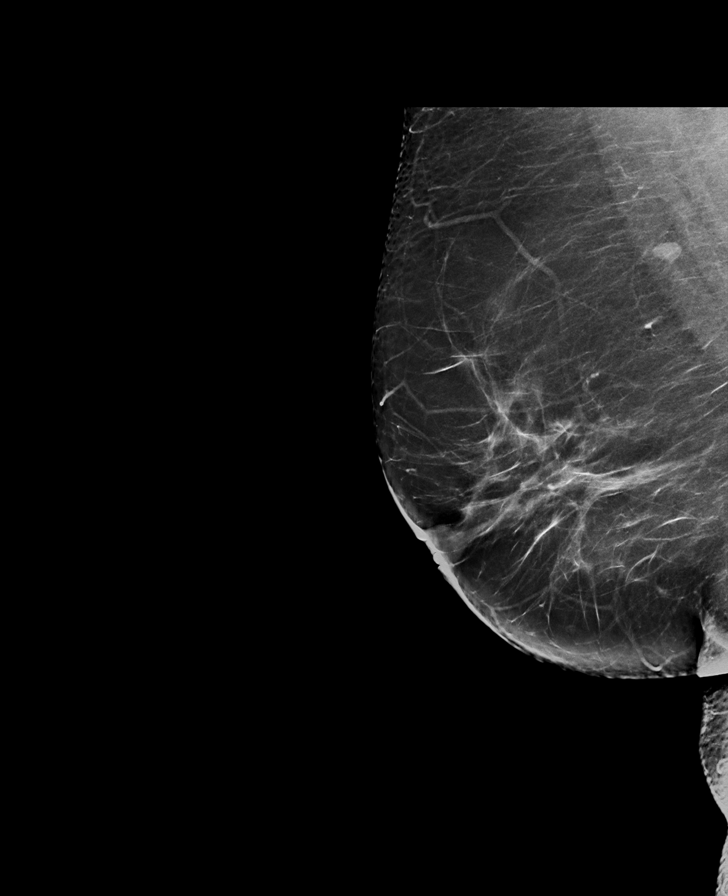

[R CC synth-2D]
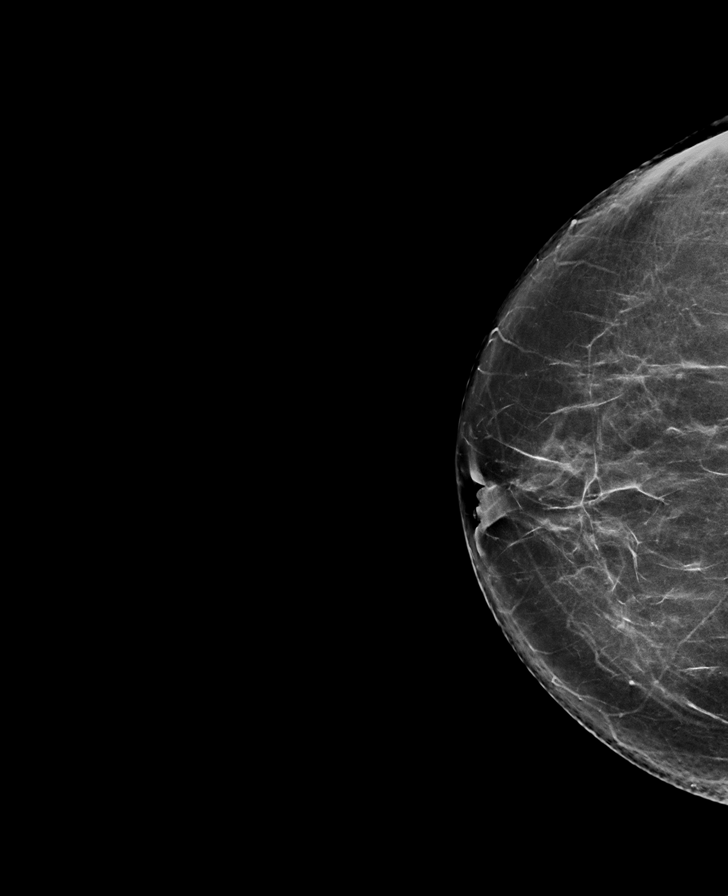

[L CC synth-2D]
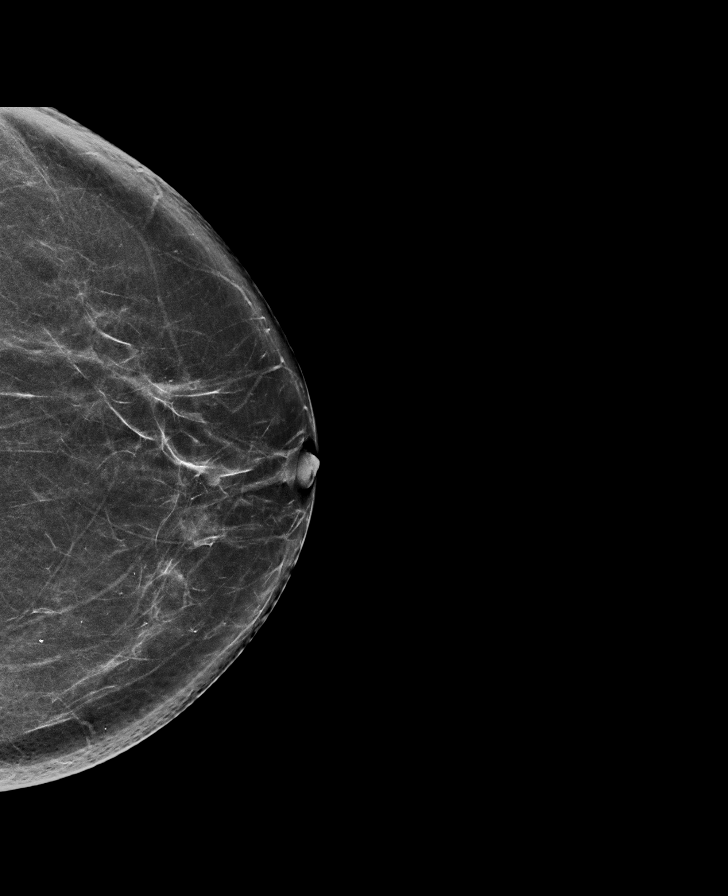

[L MLO synth-2D]
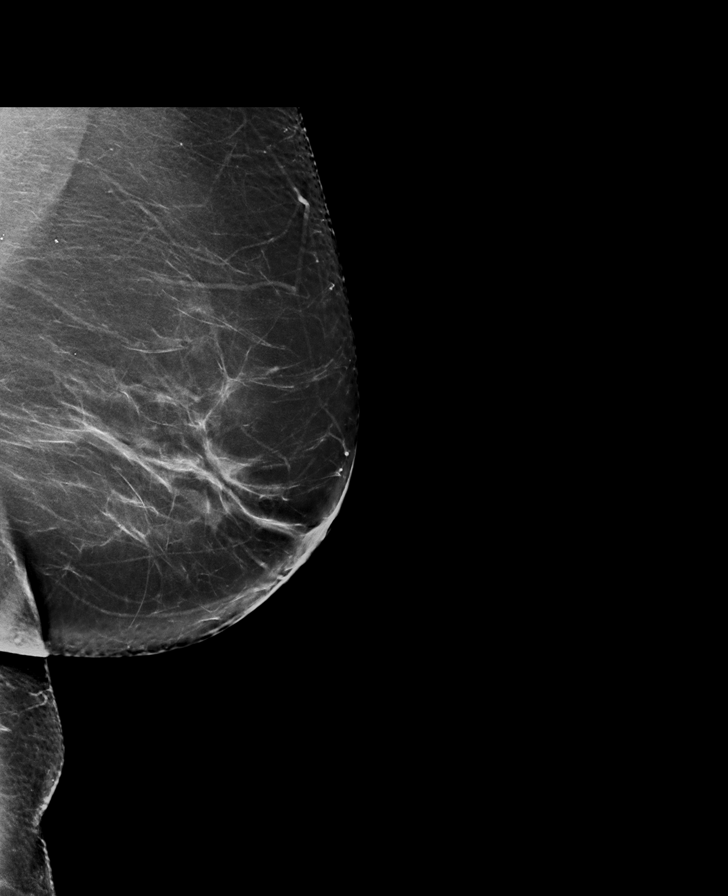

[R CC tomo · tomo slice 41/81.0]
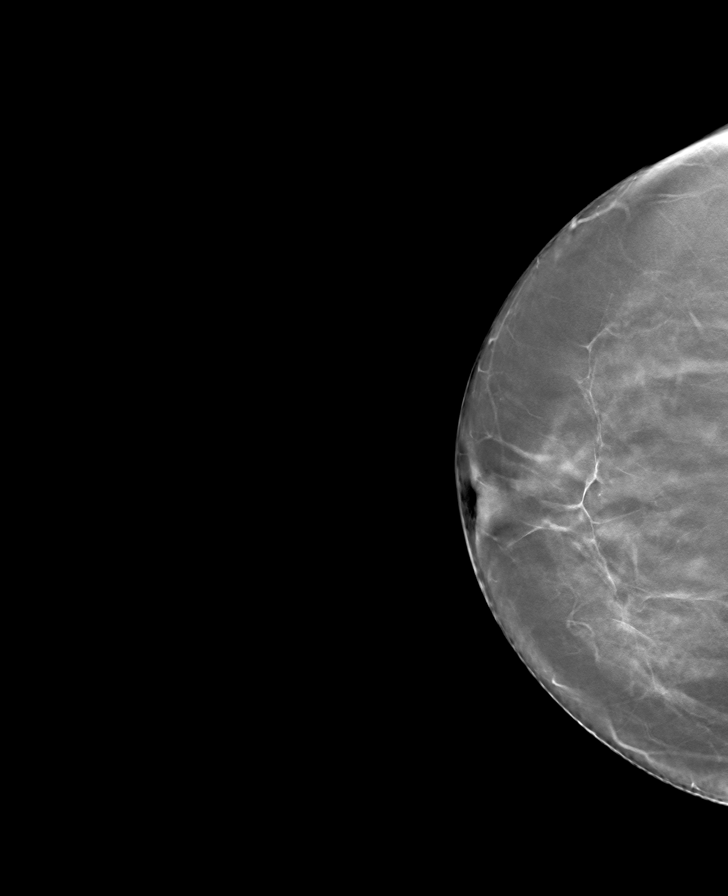

[L MLO tomo · tomo slice 43/86.0]
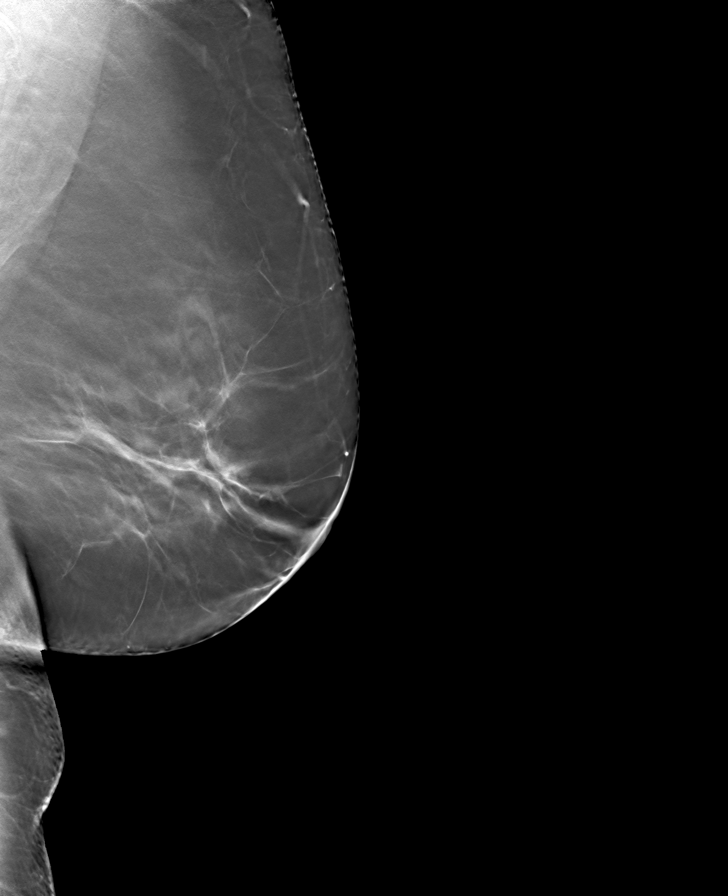

[L CC tomo · tomo slice 39/78.0]
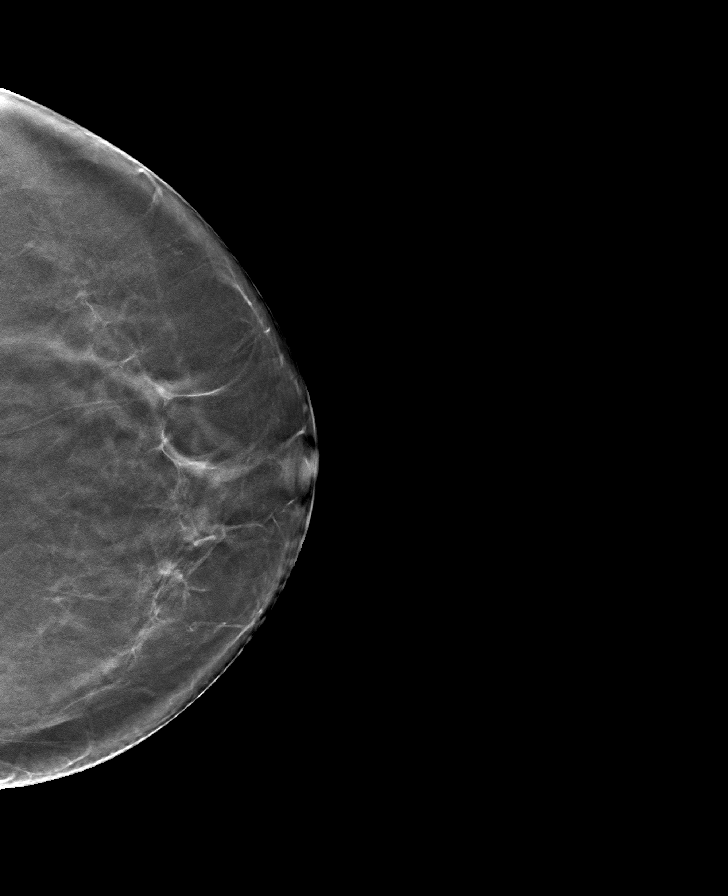

[R MLO tomo · tomo slice 42/83.0]
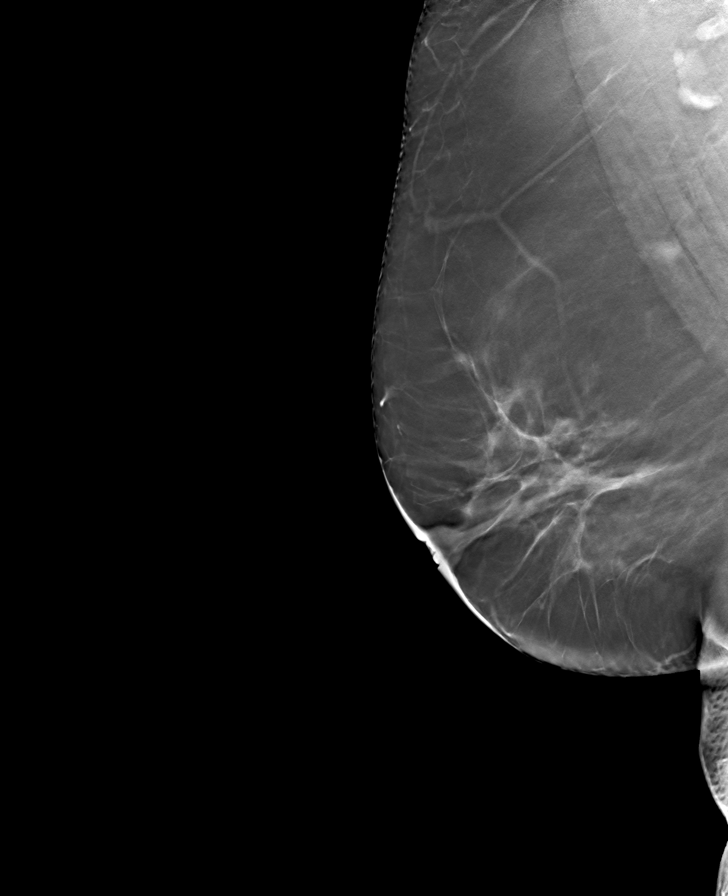

[8 of 24 positions shown; findings below may reference images not displayed]

ACR Breast Density Category b: There are scattered areas of
fibroglandular density.
FINDINGS: In the left breast, a possible mass warrants further evaluation. In
the right breast, no findings suspicious for malignancy.

Images were processed with CAD.
IMPRESSION: Further evaluation is suggested for possible mass in the left
breast.

RECOMMENDATION:
Diagnostic mammogram and possibly ultrasound of the left breast.
(Code:JC-2-SSL)

The patient will be contacted regarding the findings, and additional
imaging will be scheduled.

BI-RADS CATEGORY  0: Incomplete. Need additional imaging evaluation
and/or prior mammograms for comparison.

## 2021-06-15 DIAGNOSIS — L820 Inflamed seborrheic keratosis: Secondary | ICD-10-CM | POA: Diagnosis not present

## 2021-06-15 DIAGNOSIS — Z1283 Encounter for screening for malignant neoplasm of skin: Secondary | ICD-10-CM | POA: Diagnosis not present

## 2021-06-15 DIAGNOSIS — L304 Erythema intertrigo: Secondary | ICD-10-CM | POA: Diagnosis not present

## 2021-06-15 DIAGNOSIS — L908 Other atrophic disorders of skin: Secondary | ICD-10-CM | POA: Diagnosis not present

## 2021-06-15 DIAGNOSIS — D225 Melanocytic nevi of trunk: Secondary | ICD-10-CM | POA: Diagnosis not present

## 2021-10-08 DIAGNOSIS — D0472 Carcinoma in situ of skin of left lower limb, including hip: Secondary | ICD-10-CM | POA: Diagnosis not present

## 2021-10-08 DIAGNOSIS — X32XXXD Exposure to sunlight, subsequent encounter: Secondary | ICD-10-CM | POA: Diagnosis not present

## 2021-10-08 DIAGNOSIS — L570 Actinic keratosis: Secondary | ICD-10-CM | POA: Diagnosis not present

## 2021-10-08 DIAGNOSIS — L739 Follicular disorder, unspecified: Secondary | ICD-10-CM | POA: Diagnosis not present

## 2021-11-19 DIAGNOSIS — Z85828 Personal history of other malignant neoplasm of skin: Secondary | ICD-10-CM | POA: Diagnosis not present

## 2021-11-19 DIAGNOSIS — Z08 Encounter for follow-up examination after completed treatment for malignant neoplasm: Secondary | ICD-10-CM | POA: Diagnosis not present

## 2021-11-30 DIAGNOSIS — D519 Vitamin B12 deficiency anemia, unspecified: Secondary | ICD-10-CM | POA: Diagnosis not present

## 2021-11-30 DIAGNOSIS — I10 Essential (primary) hypertension: Secondary | ICD-10-CM | POA: Diagnosis not present

## 2021-11-30 DIAGNOSIS — E559 Vitamin D deficiency, unspecified: Secondary | ICD-10-CM | POA: Diagnosis not present

## 2021-11-30 DIAGNOSIS — R5383 Other fatigue: Secondary | ICD-10-CM | POA: Diagnosis not present

## 2021-11-30 DIAGNOSIS — E7849 Other hyperlipidemia: Secondary | ICD-10-CM | POA: Diagnosis not present

## 2021-12-07 DIAGNOSIS — Z23 Encounter for immunization: Secondary | ICD-10-CM | POA: Diagnosis not present

## 2021-12-07 DIAGNOSIS — Z0001 Encounter for general adult medical examination with abnormal findings: Secondary | ICD-10-CM | POA: Diagnosis not present

## 2021-12-07 DIAGNOSIS — E7849 Other hyperlipidemia: Secondary | ICD-10-CM | POA: Diagnosis not present

## 2021-12-07 DIAGNOSIS — K061 Gingival enlargement: Secondary | ICD-10-CM | POA: Diagnosis not present

## 2021-12-07 DIAGNOSIS — R030 Elevated blood-pressure reading, without diagnosis of hypertension: Secondary | ICD-10-CM | POA: Diagnosis not present

## 2021-12-07 DIAGNOSIS — K635 Polyp of colon: Secondary | ICD-10-CM | POA: Diagnosis not present

## 2021-12-07 DIAGNOSIS — M8580 Other specified disorders of bone density and structure, unspecified site: Secondary | ICD-10-CM | POA: Diagnosis not present

## 2021-12-07 DIAGNOSIS — Z1389 Encounter for screening for other disorder: Secondary | ICD-10-CM | POA: Diagnosis not present

## 2021-12-07 DIAGNOSIS — G4762 Sleep related leg cramps: Secondary | ICD-10-CM | POA: Diagnosis not present

## 2022-02-18 ENCOUNTER — Other Ambulatory Visit (HOSPITAL_COMMUNITY): Payer: Self-pay | Admitting: Family Medicine

## 2022-02-18 DIAGNOSIS — Z1231 Encounter for screening mammogram for malignant neoplasm of breast: Secondary | ICD-10-CM

## 2022-04-05 ENCOUNTER — Encounter (HOSPITAL_COMMUNITY): Payer: Self-pay

## 2022-04-05 ENCOUNTER — Ambulatory Visit (HOSPITAL_COMMUNITY)
Admission: RE | Admit: 2022-04-05 | Discharge: 2022-04-05 | Disposition: A | Discharge status: Home / Self Care | Payer: Medicare Other | Source: Ambulatory Visit | Attending: Family Medicine | Admitting: Family Medicine

## 2022-04-05 DIAGNOSIS — Z1231 Encounter for screening mammogram for malignant neoplasm of breast: Secondary | ICD-10-CM | POA: Insufficient documentation

## 2022-05-10 DIAGNOSIS — M25562 Pain in left knee: Secondary | ICD-10-CM | POA: Diagnosis not present

## 2022-05-26 DIAGNOSIS — S86111D Strain of other muscle(s) and tendon(s) of posterior muscle group at lower leg level, right leg, subsequent encounter: Secondary | ICD-10-CM | POA: Diagnosis not present

## 2022-05-27 DIAGNOSIS — S86112A Strain of other muscle(s) and tendon(s) of posterior muscle group at lower leg level, left leg, initial encounter: Secondary | ICD-10-CM | POA: Diagnosis not present

## 2022-05-31 DIAGNOSIS — S86112A Strain of other muscle(s) and tendon(s) of posterior muscle group at lower leg level, left leg, initial encounter: Secondary | ICD-10-CM | POA: Diagnosis not present

## 2022-06-01 DIAGNOSIS — R5383 Other fatigue: Secondary | ICD-10-CM | POA: Diagnosis not present

## 2022-06-01 DIAGNOSIS — E7849 Other hyperlipidemia: Secondary | ICD-10-CM | POA: Diagnosis not present

## 2022-06-02 DIAGNOSIS — S86112A Strain of other muscle(s) and tendon(s) of posterior muscle group at lower leg level, left leg, initial encounter: Secondary | ICD-10-CM | POA: Diagnosis not present

## 2022-06-04 DIAGNOSIS — S86112A Strain of other muscle(s) and tendon(s) of posterior muscle group at lower leg level, left leg, initial encounter: Secondary | ICD-10-CM | POA: Diagnosis not present

## 2022-06-08 DIAGNOSIS — E7849 Other hyperlipidemia: Secondary | ICD-10-CM | POA: Diagnosis not present

## 2022-06-08 DIAGNOSIS — M25562 Pain in left knee: Secondary | ICD-10-CM | POA: Diagnosis not present

## 2022-06-08 DIAGNOSIS — K635 Polyp of colon: Secondary | ICD-10-CM | POA: Diagnosis not present

## 2022-06-08 DIAGNOSIS — M1712 Unilateral primary osteoarthritis, left knee: Secondary | ICD-10-CM | POA: Diagnosis not present

## 2022-06-08 DIAGNOSIS — M8580 Other specified disorders of bone density and structure, unspecified site: Secondary | ICD-10-CM | POA: Diagnosis not present

## 2022-06-08 DIAGNOSIS — Z0001 Encounter for general adult medical examination with abnormal findings: Secondary | ICD-10-CM | POA: Diagnosis not present

## 2022-06-08 DIAGNOSIS — G4762 Sleep related leg cramps: Secondary | ICD-10-CM | POA: Diagnosis not present

## 2022-06-08 DIAGNOSIS — R030 Elevated blood-pressure reading, without diagnosis of hypertension: Secondary | ICD-10-CM | POA: Diagnosis not present

## 2022-11-22 DIAGNOSIS — E7849 Other hyperlipidemia: Secondary | ICD-10-CM | POA: Diagnosis not present

## 2022-11-22 DIAGNOSIS — E039 Hypothyroidism, unspecified: Secondary | ICD-10-CM | POA: Diagnosis not present

## 2022-11-22 DIAGNOSIS — E559 Vitamin D deficiency, unspecified: Secondary | ICD-10-CM | POA: Diagnosis not present

## 2022-11-29 DIAGNOSIS — E7849 Other hyperlipidemia: Secondary | ICD-10-CM | POA: Diagnosis not present

## 2022-11-29 DIAGNOSIS — M1712 Unilateral primary osteoarthritis, left knee: Secondary | ICD-10-CM | POA: Diagnosis not present

## 2022-11-29 DIAGNOSIS — M8580 Other specified disorders of bone density and structure, unspecified site: Secondary | ICD-10-CM | POA: Diagnosis not present

## 2022-11-29 DIAGNOSIS — R030 Elevated blood-pressure reading, without diagnosis of hypertension: Secondary | ICD-10-CM | POA: Diagnosis not present

## 2022-11-29 DIAGNOSIS — S2001XA Contusion of right breast, initial encounter: Secondary | ICD-10-CM | POA: Diagnosis not present

## 2022-11-29 DIAGNOSIS — Z23 Encounter for immunization: Secondary | ICD-10-CM | POA: Diagnosis not present

## 2023-02-22 ENCOUNTER — Other Ambulatory Visit (HOSPITAL_COMMUNITY): Payer: Self-pay | Admitting: Family Medicine

## 2023-02-22 DIAGNOSIS — Z1231 Encounter for screening mammogram for malignant neoplasm of breast: Secondary | ICD-10-CM

## 2023-04-05 ENCOUNTER — Encounter (HOSPITAL_COMMUNITY): Payer: Self-pay | Admitting: Family Medicine

## 2023-04-06 ENCOUNTER — Encounter (HOSPITAL_COMMUNITY): Payer: Self-pay

## 2023-04-06 ENCOUNTER — Ambulatory Visit (HOSPITAL_COMMUNITY): Payer: Medicare Other

## 2023-04-07 ENCOUNTER — Ambulatory Visit
Admission: RE | Admit: 2023-04-07 | Discharge: 2023-04-07 | Disposition: A | Discharge status: Home / Self Care | Source: Ambulatory Visit | Attending: Family Medicine | Admitting: Family Medicine

## 2023-04-07 DIAGNOSIS — Z1231 Encounter for screening mammogram for malignant neoplasm of breast: Secondary | ICD-10-CM

## 2023-04-12 ENCOUNTER — Encounter: Payer: Self-pay | Admitting: Family Medicine

## 2023-04-12 ENCOUNTER — Other Ambulatory Visit: Payer: Self-pay | Admitting: Family Medicine

## 2023-04-12 DIAGNOSIS — N6310 Unspecified lump in the right breast, unspecified quadrant: Secondary | ICD-10-CM

## 2023-04-12 DIAGNOSIS — N644 Mastodynia: Secondary | ICD-10-CM

## 2023-04-13 ENCOUNTER — Ambulatory Visit
Admission: RE | Admit: 2023-04-13 | Discharge: 2023-04-13 | Disposition: A | Discharge status: Home / Self Care | Source: Ambulatory Visit | Attending: Family Medicine | Admitting: Family Medicine

## 2023-04-13 DIAGNOSIS — N641 Fat necrosis of breast: Secondary | ICD-10-CM | POA: Diagnosis not present

## 2023-04-13 DIAGNOSIS — N6310 Unspecified lump in the right breast, unspecified quadrant: Secondary | ICD-10-CM

## 2023-04-13 DIAGNOSIS — N644 Mastodynia: Secondary | ICD-10-CM

## 2023-04-13 DIAGNOSIS — N6459 Other signs and symptoms in breast: Secondary | ICD-10-CM | POA: Diagnosis not present

## 2023-04-14 DIAGNOSIS — D225 Melanocytic nevi of trunk: Secondary | ICD-10-CM | POA: Diagnosis not present

## 2023-04-14 DIAGNOSIS — L820 Inflamed seborrheic keratosis: Secondary | ICD-10-CM | POA: Diagnosis not present

## 2023-04-14 DIAGNOSIS — Z1283 Encounter for screening for malignant neoplasm of skin: Secondary | ICD-10-CM | POA: Diagnosis not present

## 2023-04-14 DIAGNOSIS — X32XXXD Exposure to sunlight, subsequent encounter: Secondary | ICD-10-CM | POA: Diagnosis not present

## 2023-04-14 DIAGNOSIS — L570 Actinic keratosis: Secondary | ICD-10-CM | POA: Diagnosis not present

## 2023-06-07 DIAGNOSIS — Z0001 Encounter for general adult medical examination with abnormal findings: Secondary | ICD-10-CM | POA: Diagnosis not present

## 2023-06-07 DIAGNOSIS — R5383 Other fatigue: Secondary | ICD-10-CM | POA: Diagnosis not present

## 2023-06-07 DIAGNOSIS — D519 Vitamin B12 deficiency anemia, unspecified: Secondary | ICD-10-CM | POA: Diagnosis not present

## 2023-06-07 DIAGNOSIS — E7849 Other hyperlipidemia: Secondary | ICD-10-CM | POA: Diagnosis not present

## 2023-06-07 DIAGNOSIS — D649 Anemia, unspecified: Secondary | ICD-10-CM | POA: Diagnosis not present

## 2023-06-13 DIAGNOSIS — E7849 Other hyperlipidemia: Secondary | ICD-10-CM | POA: Diagnosis not present

## 2023-06-13 DIAGNOSIS — Z0001 Encounter for general adult medical examination with abnormal findings: Secondary | ICD-10-CM | POA: Diagnosis not present

## 2023-06-13 DIAGNOSIS — M8580 Other specified disorders of bone density and structure, unspecified site: Secondary | ICD-10-CM | POA: Diagnosis not present

## 2023-06-13 DIAGNOSIS — Z1389 Encounter for screening for other disorder: Secondary | ICD-10-CM | POA: Diagnosis not present

## 2023-08-04 DIAGNOSIS — H43393 Other vitreous opacities, bilateral: Secondary | ICD-10-CM | POA: Diagnosis not present

## 2024-02-09 ENCOUNTER — Encounter: Payer: Self-pay | Admitting: Gastroenterology

## 2024-03-14 ENCOUNTER — Ambulatory Visit: Admitting: Gastroenterology
